# Patient Record
Sex: Male | Born: 1970
Health system: Southern US, Community
[De-identification: ages and names within clinical notes are randomized; demographics above are authoritative.]

## PROBLEM LIST (undated history)

## (undated) DIAGNOSIS — H3530 Unspecified macular degeneration: Secondary | ICD-10-CM

## (undated) DIAGNOSIS — G4730 Sleep apnea, unspecified: Secondary | ICD-10-CM

## (undated) DIAGNOSIS — E785 Hyperlipidemia, unspecified: Secondary | ICD-10-CM

## (undated) DIAGNOSIS — E119 Type 2 diabetes mellitus without complications: Secondary | ICD-10-CM

## (undated) DIAGNOSIS — I10 Essential (primary) hypertension: Secondary | ICD-10-CM

## (undated) HISTORY — DX: Essential (primary) hypertension: I10

## (undated) HISTORY — DX: Sleep apnea, unspecified: G47.30

## (undated) HISTORY — DX: Type 2 diabetes mellitus without complications: E11.9

## (undated) HISTORY — DX: Unspecified macular degeneration: H35.30

## (undated) HISTORY — DX: Hyperlipidemia, unspecified: E78.5

---

## 1998-09-12 ENCOUNTER — Emergency Department (HOSPITAL_COMMUNITY): Admission: EM | Admit: 1998-09-12 | Discharge: 1998-09-12 | Payer: Self-pay

## 2003-06-07 ENCOUNTER — Encounter: Payer: Self-pay | Admitting: Family Medicine

## 2003-06-07 ENCOUNTER — Encounter: Admission: RE | Admit: 2003-06-07 | Discharge: 2003-06-07 | Payer: Self-pay | Admitting: Family Medicine

## 2006-07-30 ENCOUNTER — Ambulatory Visit: Payer: Self-pay | Admitting: Family Medicine

## 2006-09-19 ENCOUNTER — Ambulatory Visit: Payer: Self-pay | Admitting: Family Medicine

## 2006-11-12 ENCOUNTER — Ambulatory Visit: Payer: Self-pay | Admitting: Family Medicine

## 2007-01-16 ENCOUNTER — Ambulatory Visit: Payer: Self-pay | Admitting: Family Medicine

## 2007-01-16 LAB — CONVERTED CEMR LAB
CO2: 29 meq/L (ref 19–32)
Calcium: 9.4 mg/dL (ref 8.4–10.5)
Chloride: 104 meq/L (ref 96–112)
Cholesterol: 195 mg/dL (ref 0–200)
Glucose, Bld: 99 mg/dL (ref 70–99)
HDL: 38.7 mg/dL — ABNORMAL LOW (ref >39.0)
Sodium: 140 meq/L (ref 135–145)
Total CHOL/HDL Ratio: 5

## 2007-02-05 ENCOUNTER — Ambulatory Visit: Payer: Self-pay | Admitting: Family Medicine

## 2007-02-11 DIAGNOSIS — I10 Essential (primary) hypertension: Secondary | ICD-10-CM

## 2007-03-23 ENCOUNTER — Ambulatory Visit: Payer: Self-pay | Admitting: Family Medicine

## 2007-03-23 LAB — CONVERTED CEMR LAB
Chloride: 101 meq/L (ref 96–112)
Creatinine, Ser: 1.1 mg/dL (ref 0.4–1.5)
Glucose, Bld: 92 mg/dL (ref 70–99)
Potassium: 3.6 meq/L (ref 3.5–5.1)
Sodium: 138 meq/L (ref 135–145)

## 2007-06-04 ENCOUNTER — Ambulatory Visit: Payer: Self-pay | Admitting: Family Medicine

## 2007-06-05 ENCOUNTER — Telehealth (INDEPENDENT_AMBULATORY_CARE_PROVIDER_SITE_OTHER): Payer: Self-pay | Admitting: *Deleted

## 2007-06-05 LAB — CONVERTED CEMR LAB
Cholesterol: 220 mg/dL (ref 0–200)
Direct LDL: 158.5 mg/dL
Total CHOL/HDL Ratio: 5
Triglycerides: 70 mg/dL (ref 0–149)
VLDL: 14 mg/dL (ref 0–40)

## 2007-07-29 ENCOUNTER — Telehealth (INDEPENDENT_AMBULATORY_CARE_PROVIDER_SITE_OTHER): Payer: Self-pay | Admitting: *Deleted

## 2007-12-08 ENCOUNTER — Telehealth (INDEPENDENT_AMBULATORY_CARE_PROVIDER_SITE_OTHER): Payer: Self-pay | Admitting: *Deleted

## 2007-12-09 ENCOUNTER — Ambulatory Visit: Payer: Self-pay | Admitting: Family Medicine

## 2007-12-09 LAB — CONVERTED CEMR LAB
BUN: 14 mg/dL (ref 6–23)
Calcium: 9.6 mg/dL (ref 8.4–10.5)
Chloride: 100 meq/L (ref 96–112)
Direct LDL: 143.3 mg/dL
GFR calc Af Amer: 97 mL/min
GFR calc non Af Amer: 80 mL/min
Total CHOL/HDL Ratio: 4.7
Triglycerides: 88 mg/dL (ref 0–149)
VLDL: 18 mg/dL (ref 0–40)

## 2007-12-10 ENCOUNTER — Encounter (INDEPENDENT_AMBULATORY_CARE_PROVIDER_SITE_OTHER): Payer: Self-pay | Admitting: *Deleted

## 2007-12-11 ENCOUNTER — Encounter (INDEPENDENT_AMBULATORY_CARE_PROVIDER_SITE_OTHER): Payer: Self-pay | Admitting: Family Medicine

## 2007-12-30 ENCOUNTER — Encounter (INDEPENDENT_AMBULATORY_CARE_PROVIDER_SITE_OTHER): Payer: Self-pay | Admitting: Family Medicine

## 2008-03-03 ENCOUNTER — Encounter (INDEPENDENT_AMBULATORY_CARE_PROVIDER_SITE_OTHER): Payer: Self-pay | Admitting: Family Medicine

## 2008-05-20 ENCOUNTER — Telehealth (INDEPENDENT_AMBULATORY_CARE_PROVIDER_SITE_OTHER): Payer: Self-pay | Admitting: *Deleted

## 2008-08-29 ENCOUNTER — Telehealth (INDEPENDENT_AMBULATORY_CARE_PROVIDER_SITE_OTHER): Payer: Self-pay | Admitting: *Deleted

## 2008-08-31 ENCOUNTER — Ambulatory Visit: Payer: Self-pay | Admitting: Family Medicine

## 2008-10-31 ENCOUNTER — Telehealth (INDEPENDENT_AMBULATORY_CARE_PROVIDER_SITE_OTHER): Payer: Self-pay | Admitting: *Deleted

## 2008-11-02 ENCOUNTER — Telehealth (INDEPENDENT_AMBULATORY_CARE_PROVIDER_SITE_OTHER): Payer: Self-pay | Admitting: *Deleted

## 2008-12-06 ENCOUNTER — Ambulatory Visit: Payer: Self-pay | Admitting: Family Medicine

## 2008-12-06 DIAGNOSIS — R1084 Generalized abdominal pain: Secondary | ICD-10-CM

## 2008-12-06 LAB — CONVERTED CEMR LAB
Bilirubin Urine: NEGATIVE
Glucose, Urine, Semiquant: NEGATIVE
Ketones, urine, test strip: NEGATIVE
Specific Gravity, Urine: 1.015
Urobilinogen, UA: 0.2
pH: 5

## 2009-04-12 ENCOUNTER — Ambulatory Visit: Payer: Self-pay | Admitting: Family Medicine

## 2009-04-12 DIAGNOSIS — J309 Allergic rhinitis, unspecified: Secondary | ICD-10-CM | POA: Insufficient documentation

## 2009-04-14 ENCOUNTER — Encounter (INDEPENDENT_AMBULATORY_CARE_PROVIDER_SITE_OTHER): Payer: Self-pay | Admitting: *Deleted

## 2009-04-14 LAB — CONVERTED CEMR LAB
Albumin: 4 g/dL (ref 3.5–5.2)
BUN: 9 mg/dL (ref 6–23)
Basophils Absolute: 0 10*3/uL (ref 0.0–0.1)
CO2: 32 meq/L (ref 19–32)
Calcium: 9 mg/dL (ref 8.4–10.5)
Cholesterol: 195 mg/dL (ref 0–200)
Eosinophils Absolute: 0.1 10*3/uL (ref 0.0–0.7)
GFR calc non Af Amer: 106.83 mL/min (ref >60)
Glucose, Bld: 108 mg/dL — ABNORMAL HIGH (ref 70–99)
HCT: 39.3 % (ref 39.0–52.0)
HDL: 41.7 mg/dL (ref >39.00)
Hemoglobin: 13.2 g/dL (ref 13.0–17.0)
Lymphocytes Relative: 36.4 % (ref 12.0–46.0)
Lymphs Abs: 1.3 10*3/uL (ref 0.7–4.0)
MCHC: 33.5 g/dL (ref 30.0–36.0)
Neutro Abs: 1.8 10*3/uL (ref 1.4–7.7)
Platelets: 218 10*3/uL (ref 150.0–400.0)
RDW: 11.5 % (ref 11.5–14.6)
Sodium: 140 meq/L (ref 135–145)
TSH: 1.04 microintl units/mL (ref 0.35–5.50)
Total Bilirubin: 0.8 mg/dL (ref 0.3–1.2)
VLDL: 13.4 mg/dL (ref 0.0–40.0)

## 2009-05-04 ENCOUNTER — Telehealth (INDEPENDENT_AMBULATORY_CARE_PROVIDER_SITE_OTHER): Payer: Self-pay | Admitting: *Deleted

## 2009-06-21 ENCOUNTER — Ambulatory Visit: Payer: Self-pay | Admitting: Family Medicine

## 2009-06-21 DIAGNOSIS — M549 Dorsalgia, unspecified: Secondary | ICD-10-CM | POA: Insufficient documentation

## 2009-07-05 ENCOUNTER — Ambulatory Visit: Payer: Self-pay | Admitting: Family Medicine

## 2009-07-22 ENCOUNTER — Emergency Department (HOSPITAL_COMMUNITY): Admission: EM | Admit: 2009-07-22 | Discharge: 2009-07-22 | Payer: Self-pay | Admitting: Emergency Medicine

## 2009-07-24 ENCOUNTER — Telehealth (INDEPENDENT_AMBULATORY_CARE_PROVIDER_SITE_OTHER): Payer: Self-pay | Admitting: *Deleted

## 2009-08-07 ENCOUNTER — Ambulatory Visit: Payer: Self-pay | Admitting: Family Medicine

## 2009-08-07 DIAGNOSIS — R17 Unspecified jaundice: Secondary | ICD-10-CM | POA: Insufficient documentation

## 2009-08-07 DIAGNOSIS — H5789 Other specified disorders of eye and adnexa: Secondary | ICD-10-CM

## 2009-08-09 ENCOUNTER — Encounter (INDEPENDENT_AMBULATORY_CARE_PROVIDER_SITE_OTHER): Payer: Self-pay | Admitting: *Deleted

## 2009-08-09 LAB — CONVERTED CEMR LAB
AST: 24 units/L (ref 0–37)
Alkaline Phosphatase: 40 units/L (ref 39–117)
Bilirubin, Direct: 0.1 mg/dL (ref 0.0–0.3)

## 2009-08-24 ENCOUNTER — Telehealth (INDEPENDENT_AMBULATORY_CARE_PROVIDER_SITE_OTHER): Payer: Self-pay | Admitting: *Deleted

## 2009-09-07 ENCOUNTER — Encounter: Payer: Self-pay | Admitting: Family Medicine

## 2009-10-10 ENCOUNTER — Ambulatory Visit: Payer: Self-pay | Admitting: Family Medicine

## 2009-12-30 HISTORY — PX: BACK SURGERY: SHX140

## 2010-01-05 ENCOUNTER — Ambulatory Visit (HOSPITAL_COMMUNITY): Admission: RE | Admit: 2010-01-05 | Discharge: 2010-01-06 | Payer: Self-pay | Admitting: Orthopedic Surgery

## 2010-01-16 ENCOUNTER — Telehealth (INDEPENDENT_AMBULATORY_CARE_PROVIDER_SITE_OTHER): Payer: Self-pay | Admitting: *Deleted

## 2010-02-09 ENCOUNTER — Ambulatory Visit: Payer: Self-pay | Admitting: Internal Medicine

## 2010-06-19 ENCOUNTER — Ambulatory Visit: Payer: Self-pay | Admitting: Family Medicine

## 2010-06-19 DIAGNOSIS — R220 Localized swelling, mass and lump, head: Secondary | ICD-10-CM

## 2010-06-19 DIAGNOSIS — R221 Localized swelling, mass and lump, neck: Secondary | ICD-10-CM

## 2010-08-10 ENCOUNTER — Telehealth: Payer: Self-pay | Admitting: Family Medicine

## 2010-08-23 ENCOUNTER — Telehealth (INDEPENDENT_AMBULATORY_CARE_PROVIDER_SITE_OTHER): Payer: Self-pay | Admitting: *Deleted

## 2010-09-07 ENCOUNTER — Ambulatory Visit: Payer: Self-pay | Admitting: Family Medicine

## 2010-09-10 LAB — CONVERTED CEMR LAB
ALT: 32 units/L (ref 0–53)
AST: 23 units/L (ref 0–37)
BUN: 14 mg/dL (ref 6–23)
Basophils Relative: 0.3 % (ref 0.0–3.0)
Eosinophils Relative: 3.1 % (ref 0.0–5.0)
GFR calc non Af Amer: 108.58 mL/min (ref >60)
HCT: 39.1 % (ref 39.0–52.0)
HDL: 37.8 mg/dL — ABNORMAL LOW (ref >39.00)
Hemoglobin: 13.5 g/dL (ref 13.0–17.0)
Lymphs Abs: 1.4 10*3/uL (ref 0.7–4.0)
Monocytes Relative: 10 % (ref 3.0–12.0)
Platelets: 218 10*3/uL (ref 150.0–400.0)
Potassium: 4.1 meq/L (ref 3.5–5.1)
RBC: 4.64 M/uL (ref 4.22–5.81)
Sodium: 141 meq/L (ref 135–145)
TSH: 1.29 microintl units/mL (ref 0.35–5.50)
Total Bilirubin: 0.7 mg/dL (ref 0.3–1.2)
Total CHOL/HDL Ratio: 5
Total Protein: 7 g/dL (ref 6.0–8.3)
VLDL: 11.8 mg/dL (ref 0.0–40.0)
WBC: 3.4 10*3/uL — ABNORMAL LOW (ref 4.5–10.5)

## 2010-09-14 ENCOUNTER — Ambulatory Visit: Payer: Self-pay | Admitting: Family Medicine

## 2010-09-14 DIAGNOSIS — E1169 Type 2 diabetes mellitus with other specified complication: Secondary | ICD-10-CM

## 2010-09-14 DIAGNOSIS — E785 Hyperlipidemia, unspecified: Secondary | ICD-10-CM

## 2011-01-31 NOTE — Assessment & Plan Note (Signed)
Summary: cpx/lab/cbs   Vital Signs:  Patient profile:   40 year old male Height:      72 inches Weight:      251 pounds BMI:     34.16 Pulse rate:   76 / minute BP sitting:   124 / 80  (left arm)  Vitals Entered By: Doristine Devoid CMA (September 07, 2010 10:03 AM) CC: CPX AND LABS    History of Present Illness: 40 yo man here today for CPE.  no concerns today.  Preventive Screening-Counseling & Management  Alcohol-Tobacco     Smoking Status: never  Caffeine-Diet-Exercise     Does Patient Exercise: yes     Type of exercise: running      Sexual History:  currently monogamous.        Drug Use:  never.    Current Medications (verified): 1)  Norvasc 10 Mg Tabs (Amlodipine Besylate) .... Take 1 Tablet By Mouth Once A Day 2)  Coreg 6.25 Mg Tabs (Carvedilol) .... Take One Tablet Two Times A Day 3)  Fluticasone Propionate 50 Mcg/act  Susp (Fluticasone Propionate) .... 2 Sprays Each Nostril Once Daily 4)  Fish Oil 1200 Mg Caps (Omega-3 Fatty Acids) .... Take One Tablet Daily 5)  Potassium Gluconate 550 Mg Tabs (Potassium Gluconate) .... Take One Tablet Daily  Allergies (verified): 1)  ! 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    Physical Exam  General:  alert and well-developed.   Head:  Normocephalic and atraumatic  without obvious abnormalities. No apparent alopecia or balding. Eyes:  No corneal or conjunctival inflammation noted. EOMI. Perrla. Funduscopic exam benign, without hemorrhages, exudates or papilledema. Vision grossly normal. Ears:  External ear exam shows no significant lesions or deformities.  Otoscopic examination reveals clear canals, tympanic membranes are intact bilaterally without bulging, retraction, inflammation or discharge. Hearing is grossly normal bilaterally. Nose:  External nasal examination shows no deformity or inflammation. Nasal mucosa are pink and moist without lesions or exudates. Mouth:  Oral mucosa and oropharynx without lesions or exudates.  Teeth in good repair.  Neck:  No deformities, masses, or tenderness noted. Lungs:  Normal respiratory effort, chest expands symmetrically. Lungs are clear to auscultation, no crackles or wheezes. Heart:  normal rate, regular rhythm, and no murmur.   Abdomen:  soft, NT/ND, +BS Genitalia:  Testes bilaterally descended without nodularity, tenderness or masses. No scrotal masses or lesions. No penis lesions or urethral discharge. Pulses:  +2 carotid, radial, DP Extremities:  No clubbing, cyanosis, edema, or deformity noted with normal full range of motion of all joints.   Neurologic:  No cranial nerve deficits noted. Station and gait are normal. Plantar reflexes are down-going bilaterally. DTRs are symmetrical throughout. Sensory, motor and coordinative functions appear intact. Skin:  Intact without suspicious lesions or rashes Cervical Nodes:  No lymphadenopathy noted Axillary Nodes:  No palpable lymphadenopathy Psych:  Cognition and judgment appear intact. Alert and cooperative with normal attention span and concentration. No apparent delusions, illusions, hallucinations  Impression & Recommendations:  Problem # 1:  PREVENTIVE HEALTH CARE (ICD-V70.0) Assessment Unchanged pt's PE WNL.  check labs.  anticipatory guidance  provided. Orders: Venipuncture (04540) Specimen Handling (98119) TLB-Lipid Panel (80061-LIPID) TLB-BMP (Basic Metabolic Panel-BMET) (80048-METABOL) TLB-CBC Platelet - w/Differential (85025-CBCD) TLB-Hepatic/Liver Function Pnl (80076-HEPATIC) TLB-TSH (Thyroid Stimulating Hormone) (84443-TSH)  Problem # 2:  HYPERTENSION (ICD-401.9) Assessment: Unchanged adequate control today, asymptomatic.  no changes. His updated medication list for this problem includes:    Norvasc 10 Mg Tabs (Amlodipine besylate) .Marland Kitchen... Take 1 tablet by mouth once a day    Coreg 6.25 Mg Tabs (Carvedilol) .Marland Kitchen... Take one tablet two times a day  Complete Medication List: 1)  Norvasc 10 Mg Tabs (Amlodipine besylate) .... Take 1 tablet by mouth once a day 2)  Coreg 6.25 Mg Tabs (Carvedilol) .... Take one tablet two times a day 3)  Fluticasone Propionate 50 Mcg/act Susp (Fluticasone propionate) .... 2 sprays each nostril once daily 4)  Fish Oil 1200 Mg Caps (Omega-3 fatty acids) .... Take one tablet daily 5)  Potassium Gluconate 550 Mg Tabs (Potassium gluconate) .... Take one tablet daily 6)  Lipitor 20 Mg Tabs (Atorvastatin calcium) .... Take 1 tab by mouth daily  Patient Instructions: 1)  Follow up in 6 months to recheck blood pressure 2)  Your exam looks great!  Keep up the good work! 3)  We'll notify you of your lab results 4)  Call with any questions or concerns 5)  Happy Anniversary!

## 2011-01-31 NOTE — Progress Notes (Signed)
Summary: flonase refill   Phone Note Refill Request Call back at Work Phone [phone] Message from:  Patient on August 23, 2010 3:33 PM  Refills Requested: Medication #1:  FLUTICASONE PROPIONATE 50 MCG/ACT  SUSP 2 sprays each nostril once daily   Dosage confirmed as above?Dosage Confirmed   Supply Requested: 1 month [facility] [address]  Next Appointment Scheduled: 08/30/10 Initial call taken by: Lavell Islam,  August 23, 2010 3:33 PM    Prescriptions: FLUTICASONE PROPIONATE 50 MCG/ACT  SUSP (FLUTICASONE PROPIONATE) 2 sprays each nostril once daily  #1 x 3   Entered by:   Doristine Devoid CMA   Authorized by:   Neena Rhymes MD   Signed by:   Doristine Devoid CMA on 08/23/2010   Method used:   Electronically to        [facility] (retail)       [address]       [place], [place]  [address]       Ph: [phone]       Fax: [phone]   RxID:   [id number]

## 2011-01-31 NOTE — Assessment & Plan Note (Signed)
Summary: DISCUSS LIPITOR.////SPH   Vital Signs:  Patient profile:   40 year old male Height:      72 inches Weight:      253 pounds Pulse rate:   68 / minute Resp:     16 per minute BP sitting:   130 / 100  (left arm)  Vitals Entered By: Jeremy Johann CMA (September 14, 2010 11:13 AM) CC: discuss lipitor   History of Present Illness: 40 yo man here today to discuss cholesterol.  wants to know what #s were and what they now are.  wants to know about the medicine and possible side effects.  Current Medications (verified): 1)  Norvasc 10 Mg Tabs (Amlodipine Besylate) .... Take 1 Tablet By Mouth Once A Day 2)  Coreg 6.25 Mg Tabs (Carvedilol) .... Take One Tablet Two Times A Day 3)  Fluticasone Propionate 50 Mcg/act  Susp (Fluticasone Propionate) .... 2 Sprays Each Nostril Once Daily 4)  Fish Oil 1200 Mg Caps (Omega-3 Fatty Acids) .... Take One Tablet Daily 5)  Potassium Gluconate 550 Mg Tabs (Potassium Gluconate) .... Take One Tablet Daily  Allergies (verified): 1)  ! * Lisinopril  Past History:  Past Medical History: Hypertension Hyperlipidemia  Review of Systems      See HPI  Physical Exam  General:  alert and well-developed.   Psych:  Cognition and judgment appear intact. Alert and cooperative with normal attention span and concentration. No apparent delusions, illusions, hallucinations   Impression & Recommendations:  Problem # 1:  HYPERLIPIDEMIA (ICD-272.4) Assessment New pt's cholesterol has increased 25 points since last check.  has + family hx.  pt intends to work on diet and exercise.  discussed need to repeat LFTs and reviewed side effects- abd pain, N/V, myalgias.  pt will start meds- samples and coupon card given. His updated medication list for this problem includes:    Lipitor 20 Mg Tabs (Atorvastatin calcium) .Marland Kitchen... Take 1 tab by mouth daily  Complete Medication List: 1)  Norvasc 10 Mg Tabs (Amlodipine besylate) .... Take 1 tablet by mouth once a  day 2)  Coreg 6.25 Mg Tabs (Carvedilol) .... Take one tablet two times a day 3)  Fluticasone Propionate 50 Mcg/act Susp (Fluticasone propionate) .... 2 sprays each nostril once daily 4)  Fish Oil 1200 Mg Caps (Omega-3 fatty acids) .... Take one tablet daily 5)  Potassium Gluconate 550 Mg Tabs (Potassium gluconate) .... Take one tablet daily 6)  Lipitor 20 Mg Tabs (Atorvastatin calcium) .... Take 1 tab by mouth daily  Patient Instructions: 1)  Schedule a lab visit in 6-8 weeks to recheck liver function 2)  Take Lipitor nightly before bed 3)  Focus on diet and exercise 4)  If you have any problems or concerns- call me! 5)  Don't beat yourself up about this! Prescriptions: LIPITOR 20 MG TABS (ATORVASTATIN CALCIUM) Take 1 tab by mouth daily  #30 x 3   Entered and Authorized by:   Neena Rhymes MD   Signed by:   Neena Rhymes MD on 09/14/2010   Method used:   Electronically to        [facility] [address]* (retail)       [address]       [place], [place]  [address]       Ph: [phone]       Fax: [phone]   RxID:   [id number]

## 2011-01-31 NOTE — Progress Notes (Signed)
Summary: norvasc refill   Phone Note Refill Request Message from:  Fax from Pharmacy [facility] [address] fax [phone]  amlodipine besylate 10 mg  Initial call taken by: Barb Merino,  January 16, 2010 9:30 AM    Prescriptions: NORVASC 10 MG TABS (AMLODIPINE BESYLATE) Take 1 tablet by mouth once a day  #30 Tablet x 1   Entered by:   Doristine Devoid   Authorized by:   Neena Rhymes MD   Signed by:   Doristine Devoid on 01/16/2010   Method used:   [facility]ron[phone][facility] (retail)       [address]       [place], [place]  [address]       Ph: [phone] or [phone]       Fax: [phone]   RxID:   [id number]

## 2011-01-31 NOTE — Letter (Signed)
Summary: Medical Report Form/NCDMV  Medical Report Form/[facility]   Imported By: Lanelle Bal 02/13/2010 12:33:16  _____________________________________________________________________  External Attachment:    Type:   Image     Comment:   External Document

## 2011-01-31 NOTE — Assessment & Plan Note (Signed)
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 Take 1 Tablet By Mouth Once A Day 2)  Coreg 6.25 Mg Tabs (Carvedilol) .... Take One Tablet Two Times A Day 3)  Fluticasone Propionate 50 Mcg/act  Susp (Fluticasone Propionate) .... 2 Sprays Each Nostril Once Daily 4)  Naprosyn 500 Mg Tabs (Naproxen) .Marland Kitchen.. 1 Two Times A Day X10 Days and Then As Needed.  Take W/ Food.  Allergies (verified): 1)  ! * Lisinopril  Past History:  Past Medical History: Reviewed history from 02/11/2007 and no changes required. Hypertension  Past Surgical History: back surgery 12-2009  Social History: Reviewed history from 04/12/2009 and no changes required. 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  Physical Exam  General:  alert and well-developed.   Lungs:  Normal respiratory effort, chest expands symmetrically. Lungs are clear to auscultation, no crackles or wheezes. Heart:  normal rate, regular rhythm, and no murmur.   Extremities:  no pretibial edema bilaterally    Impression & Recommendations:  Problem # 1:  HYPERTENSION (ICD-401.9) at goal  His updated medication list for this problem includes:    Norvasc 10 Mg Tabs (Amlodipine besylate) .Marland Kitchen... Take 1 tablet by mouth once a day    Coreg 6.25 Mg Tabs (Carvedilol) .Marland Kitchen... Take one tablet two times a  day  BP today: 122/80 Prior BP: 130/82 (08/07/2009)  Labs Reviewed: K+: 3.5 (04/12/2009) Creat: : 1.0 (04/12/2009)   Chol: 195 (04/12/2009)   HDL: 41.70 (04/12/2009)   LDL: 140 (04/12/2009)   TG: 67.0 (04/12/2009)  Problem # 2:  papers for the DMV done  F2F 15 min  Complete Medication List: 1)  Norvasc 10 Mg Tabs (Amlodipine besylate) .... Take 1 tablet by mouth once a day 2)  Coreg 6.25 Mg Tabs (Carvedilol) .... Take one tablet two times a day 3)  Fluticasone Propionate 50 Mcg/act Susp (Fluticasone propionate) .... 2 sprays each nostril once daily 4)  Naprosyn 500 Mg Tabs (Naproxen) .Marland Kitchen.. 1 two times a day x10 days and then as needed.  take w/ food.

## 2011-01-31 NOTE — Progress Notes (Signed)
Summary: refill patient is out of med  Phone Note Refill Request Message from:  Fax from Pharmacy on August 10, 2010 3:04 PM  Refills Requested: Medication #1:  NORVASC 10 MG TABS Take 1 tablet by mouth once a day [facility] [address] - fax [phone]  Initial call taken by: Okey Regal [place],  August 10, 2010 3:05 PM    Prescriptions: NORVASC 10 MG TABS (AMLODIPINE BESYLATE) Take 1 tablet by mouth once a day  #30 Tablet x 0   Entered and Authorized by:   Neena Rhymes MD   Signed by:   Neena Rhymes MD on 08/10/2010   Method used:   Electronically to        [id number] (retail)       [address]       [place], [place]  [address]       Ph: [phone]       Fax: [phone]   RxID:   [id number]

## 2011-01-31 NOTE — Assessment & Plan Note (Signed)
Summary: alergic reaction/cbs   Vital Signs:  Patient profile:   40 year old male Weight:      256 pounds Pulse rate:   66 / minute BP sitting:   140 / 80  (left arm)  Vitals Entered By: Doristine Devoid (June 19, 2010 11:20 AM) CC: allergic reaction? feels like uvula is swollen but not sure if it's from meds or if he is developing seafood allergy   History of Present Illness: 40 yo man here today for ? allergic rxn.  pt feels uvula is swollen, sxs started this AM.  denies any new or different meds/foods.  feet were swollen on Saturday and Sunday- now back to normal.  denies increased salt intake, CP, SOB.  was told last year when he had angioedema that uvula was 'abnormally large'.  not using steroid nasal spray, wife complains of snoring.  pt feels that uvula is 'stuck like a piece of hard candy'.  Current Medications (verified): 1)  Norvasc 10 Mg Tabs (Amlodipine Besylate) .... Take 1 Tablet By Mouth Once A Day 2)  Coreg 6.25 Mg Tabs (Carvedilol) .... Take One Tablet Two Times A Day 3)  Fluticasone Propionate 50 Mcg/act  Susp (Fluticasone Propionate) .... 2 Sprays Each Nostril Once Daily 4)  Fish Oil 1200 Mg Caps (Omega-3 Fatty Acids) .... Take One Tablet Daily 5)  Potassium Gluconate 550 Mg Tabs (Potassium Gluconate) .... Take One Tablet Daily 6)  Prednisone 20 Mg Tabs (Prednisone) .... 2 Tabs Daily X5 Days  Allergies (verified): 1)  ! * Lisinopril  Review of Systems      See HPI  Physical Exam  General:  alert and well-developed.   Head:  Normocephalic and atraumatic without obvious abnormalities. No apparent alopecia or balding. Nose:  markedly edematous turbinates Mouth:  Oral mucosa and oropharynx without lesions or exudates.  Teeth in good repair.  + PND, large uvula w/out evidence of edema Lungs:  Normal respiratory effort, chest expands symmetrically. Lungs are clear to auscultation, no crackles or wheezes. Heart:  normal rate, regular rhythm, and no murmur.      Impression & Recommendations:  Problem # 1:  SWELLING MASS OR LUMP IN HEAD AND NECK (ICD-784.2) Assessment New  pt w/ very large uvula.  after reviewing meds pt is not taking anything that would cause a rxn similar to this.  given markedly edematous turbinates and PND suspect that pt's untreated allergies are contributing to uvular swelling.  tx w/ prednisone.  refer to ENT.  reviewed supportive care and red flags that should prompt return.  Pt expresses understanding and is in agreement w/ this plan.  Orders: ENT Referral (ENT)  Complete Medication List: 1)  Norvasc 10 Mg Tabs (Amlodipine besylate) .... Take 1 tablet by mouth once a day 2)  Coreg 6.25 Mg Tabs (Carvedilol) .... Take one tablet two times a day 3)  Fluticasone Propionate 50 Mcg/act Susp (Fluticasone propionate) .... 2 sprays each nostril once daily 4)  Fish Oil 1200 Mg Caps (Omega-3 fatty acids) .... Take one tablet daily 5)  Potassium Gluconate 550 Mg Tabs (Potassium gluconate) .... Take one tablet daily 6)  Prednisone 20 Mg Tabs (Prednisone) .... 2 tabs daily x5 days  Patient Instructions: 1)  Take the prednisone as directed- take w/ food to avoid upset stomach 2)  Someone will call you with the ENT appt 3)  Add back your blood pressure meds 1 at a time- add the 2nd med after 3 days 4)  Use the nasal spray  to decrease congestion and post nasal drip 5)  Hang in there!!! Prescriptions: FLUTICASONE PROPIONATE 50 MCG/ACT  SUSP (FLUTICASONE PROPIONATE) 2 sprays each nostril once daily  #1 x 3   Entered and Authorized by:   Neena Rhymes MD   Signed by:   Neena Rhymes MD on 06/19/2010   Method used:   [facility]ronically to        [facility] # [phone]* (retail)       [address]       [place], [place]  [address]       Ph: [phone]       Fax: [phone]   RxID:   [id number] PREDNISONE 20 MG TABS (PREDNISONE) 2 tabs daily x5 days  #10 x 0   Entered and Authorized by:   Neena Rhymes  MD   Signed by:   Neena Rhymes MD on 06/19/2010   Method used:   [facility]ronically to        [id number]* (retail)       [address]       [place], [place]  [address]       Ph: [phone]       Fax: [phone]   RxID:   [id number]

## 2011-03-17 LAB — TYPE AND SCREEN: Antibody Screen: NEGATIVE

## 2011-03-21 ENCOUNTER — Other Ambulatory Visit: Payer: Self-pay | Admitting: Family Medicine

## 2011-03-21 MED ORDER — ATORVASTATIN CALCIUM 20 MG PO TABS: 20.0000 mg | ORAL_TABLET | Freq: Every day | ORAL | Status: DC

## 2011-03-21 NOTE — Telephone Encounter (Signed)
Per [facility] pt appears to be due for labs.  Copied from [facility]: LDL has increased from 140 --> 164.  this places him at higher risk for heart attack and stroke.  still encourage regular exercise and healthy diet but also needs to start lipitor 20mg  (will be generic later this fall).  recheck LFTs in 6-8 weeks   Signed by Neena Rhymes MD on 09/07/2010 at 9:27 PM

## 2011-03-27 ENCOUNTER — Other Ambulatory Visit: Payer: Self-pay | Admitting: Family Medicine

## 2011-03-27 NOTE — Telephone Encounter (Signed)
Per [facility], pt is due for 6 mo follow up.

## 2011-04-01 LAB — CBC
Hemoglobin: 12.5 g/dL — ABNORMAL LOW (ref 13.0–17.0)
MCHC: 33.9 g/dL (ref 30.0–36.0)
MCV: 82.8 fL (ref 78.0–100.0)
RBC: 4.44 MIL/uL (ref 4.22–5.81)

## 2011-04-01 LAB — URINALYSIS, ROUTINE W REFLEX MICROSCOPIC
Glucose, UA: NEGATIVE mg/dL
Ketones, ur: NEGATIVE mg/dL
Protein, ur: NEGATIVE mg/dL
Urobilinogen, UA: 0.2 mg/dL (ref 0.0–1.0)

## 2011-04-01 LAB — PROTIME-INR
INR: 0.96 (ref 0.00–1.49)
Prothrombin Time: 12.7 seconds (ref 11.6–15.2)

## 2011-04-01 LAB — APTT: aPTT: 28 seconds (ref 24–37)

## 2011-04-01 LAB — DIFFERENTIAL
Eosinophils Absolute: 0.2 10*3/uL (ref 0.0–0.7)
Lymphocytes Relative: 36 % (ref 12–46)
Lymphs Abs: 1.6 10*3/uL (ref 0.7–4.0)
Neutrophils Relative %: 47 % (ref 43–77)

## 2011-04-01 LAB — COMPREHENSIVE METABOLIC PANEL
CO2: 28 mEq/L (ref 19–32)
Calcium: 9 mg/dL (ref 8.4–10.5)
Creatinine, Ser: 1.06 mg/dL (ref 0.4–1.5)
GFR calc non Af Amer: >60 mL/min (ref >60)
Glucose, Bld: 120 mg/dL — ABNORMAL HIGH (ref 70–99)

## 2011-05-08 ENCOUNTER — Other Ambulatory Visit: Payer: Self-pay | Admitting: Family Medicine

## 2011-05-08 NOTE — Telephone Encounter (Signed)
Pt notified that last refill had notation that labs are needed. Pt will come on Tuesday.

## 2011-05-14 ENCOUNTER — Other Ambulatory Visit: Payer: Self-pay

## 2011-05-17 NOTE — Assessment & Plan Note (Signed)
[facility]                        [place] OFFICE NOTE   NAME:Martos, GAELEN BRAGER                     MRN:          [id number]  DATE:03/23/2007                            DOB:          1971-01-03    REASON FOR VISIT:  Mr. Bartunek is a 40 year old male presenting for  blood pressure check. Of note, Mr. Polio called me on February 04, 2007  for sinusitis. At that point, his blood pressure was 112/80. He has  previously seen me on January 16, 2007 where his Norvasc was increased  from 2.5 mg to 5 mg daily. The patient reports that he had 2.5 mg at  home and was taking 2 of those daily for about a week. He went to the  pharmacy and picked up a refill     Leanne Chang, M.D.     [place]/[facility]  DD: 03/23/2007  DT: 03/23/2007  Job #: [id number]

## 2011-05-17 NOTE — Assessment & Plan Note (Signed)
[facility]                        [place] OFFICE NOTE   NAME:Christian Henry, Christian Henry                     MRN:          [id number]  DATE:03/23/2007                            DOB:          20-Jan-1971    REASON FOR VISIT:  Blood pressure check.   Christian Henry is a 40 year old male presenting for his blood pressure  check.  Of note, I did see Christian Henry on January 16, 2007 for a  sinusitis.  At that point, his blood pressure was 112/80.  Previously, I  had seen him on __________   Sanjuana Mae, M.D.     [facility]  DD: 03/23/2007  DT: 03/23/2007  Job #: [id number]

## 2011-05-22 ENCOUNTER — Telehealth: Payer: Self-pay | Admitting: Family Medicine

## 2011-05-22 NOTE — Telephone Encounter (Signed)
Patient has appt for CPX on 09/27/2011 at 10:00am  He wants to know if he has to "do his labs for his liver" before 2011-09-27 in order to get refills or can he continue to get refills and have labs on Sep 27, 2011 with his physical??   If he needs labs before 2023-09-27, what are orders and codes??    thanks

## 2011-05-22 NOTE — Telephone Encounter (Addendum)
See EMR for lab orders and when labs need to be checked. 272.4. Pt wiil need labs to continue refills. Left message to call office.

## 2011-05-23 NOTE — Telephone Encounter (Signed)
Has lab appt for 06/04/2011 at 8am--can only come in on Tuesday and, due to holiday, this was the soonest he could make

## 2011-06-01 ENCOUNTER — Other Ambulatory Visit: Payer: Self-pay | Admitting: Family Medicine

## 2011-06-03 ENCOUNTER — Other Ambulatory Visit: Payer: Self-pay | Admitting: *Deleted

## 2011-06-03 DIAGNOSIS — E785 Hyperlipidemia, unspecified: Secondary | ICD-10-CM

## 2011-06-03 NOTE — Telephone Encounter (Signed)
Pt is overdue for labs (see sept 2011 ov). Sent refill and left message on voicemail to schedule labs. No further refills can be given until labs are performed.

## 2011-06-04 ENCOUNTER — Other Ambulatory Visit: Payer: Self-pay | Admitting: Family Medicine

## 2011-06-04 ENCOUNTER — Other Ambulatory Visit (INDEPENDENT_AMBULATORY_CARE_PROVIDER_SITE_OTHER): Payer: Managed Care, Other (non HMO)

## 2011-06-04 DIAGNOSIS — E782 Mixed hyperlipidemia: Secondary | ICD-10-CM

## 2011-06-04 DIAGNOSIS — E785 Hyperlipidemia, unspecified: Secondary | ICD-10-CM

## 2011-06-04 LAB — HEPATIC FUNCTION PANEL
AST: 25 U/L (ref 0–37)
Albumin: 3.8 g/dL (ref 3.5–5.2)

## 2011-06-04 NOTE — Telephone Encounter (Signed)
Duplicate request

## 2011-06-04 NOTE — Progress Notes (Signed)
Labs only

## 2011-06-07 ENCOUNTER — Encounter: Payer: Self-pay | Admitting: *Deleted

## 2011-06-12 ENCOUNTER — Encounter: Payer: Self-pay | Admitting: Family Medicine

## 2011-06-13 ENCOUNTER — Other Ambulatory Visit: Payer: Self-pay | Admitting: Family Medicine

## 2011-06-13 NOTE — Telephone Encounter (Signed)
Refill sent, pt CPX is currently scheduled for Sept 2012.

## 2011-06-21 ENCOUNTER — Encounter: Payer: Self-pay | Admitting: Family Medicine

## 2011-07-14 ENCOUNTER — Other Ambulatory Visit: Payer: Self-pay | Admitting: Family Medicine

## 2011-07-15 ENCOUNTER — Other Ambulatory Visit: Payer: Self-pay | Admitting: Family Medicine

## 2011-07-15 NOTE — Telephone Encounter (Signed)
This has already been done, lmovm notifying pharmacy.

## 2011-07-15 NOTE — Telephone Encounter (Signed)
Refill sent.

## 2011-09-10 ENCOUNTER — Encounter: Payer: Self-pay | Admitting: Family Medicine

## 2011-09-20 ENCOUNTER — Other Ambulatory Visit: Payer: Self-pay | Admitting: Family Medicine

## 2011-10-11 ENCOUNTER — Other Ambulatory Visit: Payer: Self-pay | Admitting: Family Medicine

## 2011-10-18 ENCOUNTER — Ambulatory Visit (INDEPENDENT_AMBULATORY_CARE_PROVIDER_SITE_OTHER): Payer: BC Managed Care – PPO | Admitting: Family Medicine

## 2011-10-18 ENCOUNTER — Encounter: Payer: Self-pay | Admitting: Family Medicine

## 2011-10-18 DIAGNOSIS — M7750 Other enthesopathy of unspecified foot: Secondary | ICD-10-CM

## 2011-10-18 DIAGNOSIS — I10 Essential (primary) hypertension: Secondary | ICD-10-CM

## 2011-10-18 DIAGNOSIS — E785 Hyperlipidemia, unspecified: Secondary | ICD-10-CM

## 2011-10-18 DIAGNOSIS — M7740 Metatarsalgia, unspecified foot: Secondary | ICD-10-CM

## 2011-10-18 DIAGNOSIS — Z0000 Encounter for general adult medical examination without abnormal findings: Secondary | ICD-10-CM

## 2011-10-18 LAB — HEPATIC FUNCTION PANEL
AST: 23 U/L (ref 0–37)
Albumin: 4 g/dL (ref 3.5–5.2)
Alkaline Phosphatase: 55 U/L (ref 39–117)
Total Protein: 7.2 g/dL (ref 6.0–8.3)

## 2011-10-18 LAB — CBC WITH DIFFERENTIAL/PLATELET
Eosinophils Relative: 5.8 % — ABNORMAL HIGH (ref 0.0–5.0)
HCT: 38.2 % — ABNORMAL LOW (ref 39.0–52.0)
Lymphs Abs: 1.7 10*3/uL (ref 0.7–4.0)
Monocytes Relative: 10.8 % (ref 3.0–12.0)
Neutrophils Relative %: 45.1 % (ref 43.0–77.0)
Platelets: 224 10*3/uL (ref 150.0–400.0)
WBC: 4.5 10*3/uL (ref 4.5–10.5)

## 2011-10-18 LAB — BASIC METABOLIC PANEL
CO2: 26 mEq/L (ref 19–32)
Glucose, Bld: 121 mg/dL — ABNORMAL HIGH (ref 70–99)
Potassium: 4 mEq/L (ref 3.5–5.1)
Sodium: 140 mEq/L (ref 135–145)

## 2011-10-18 LAB — LIPID PANEL: Total CHOL/HDL Ratio: 3

## 2011-10-18 NOTE — Patient Instructions (Signed)
Follow up in 6 months to recheck blood pressure and cholesterol Keep up the good work!  You look great! We'll notify you of your lab results Call with any questions or concerns Happy Holidays!!

## 2011-10-18 NOTE — Progress Notes (Signed)
  Subjective:    Patient ID: Christian Henry, male    DOB: 1971/07/26, 40 y.o.   MRN: [id number]  HPI CPE- no concerns today w/ exception of:  L foot pain- sxs started ~2 yrs ago, intermittent.  Located on ball of foot.  Will have 'tightness' and mild swelling.  Typically occurs when in reclining and feet are dangling.  No numbness.  Will have increased pain/tightness w/ 1st step.  Almost always occurs late in the day.   Review of Systems Patient reports no vision/hearing changes, anorexia, fever ,adenopathy, persistant/recurrent hoarseness, swallowing issues, chest pain, palpitations, edema, persistant/recurrent cough, hemoptysis, dyspnea (rest,exertional, paroxysmal nocturnal), gastrointestinal  bleeding (melena, rectal bleeding), abdominal pain, excessive heart burn, GU symptoms (dysuria, hematuria, voiding/incontinence issues) syncope, focal weakness, memory loss, numbness & tingling, skin/hair/nail changes, depression, anxiety, abnormal bruising/bleeding, musculoskeletal symptoms/signs.     Objective:   Physical Exam BP 125/70  Pulse 71  Temp(Src) 98 F (36.7 C) (Oral)  Ht 6\' 1"  (1.854 m)  Wt 267 lb 6.4 oz (121.292 kg)  BMI 35.28 kg/m2  General Appearance:    Alert, cooperative, no distress, appears stated age  Head:    Normocephalic, without obvious abnormality, atraumatic  Eyes:    PERRL, conjunctiva/corneas clear, EOM's intact, fundi    benign, both eyes       Ears:    Normal TM's and external ear canals, both ears  Nose:   Nares normal, septum midline, mucosa normal, no drainage   or sinus tenderness  Throat:   Lips, mucosa, and tongue normal; teeth and gums normal  Neck:   Supple, symmetrical, trachea midline, no adenopathy;       thyroid:  No enlargement/tenderness/nodules  Back:     Symmetric, no curvature, ROM normal, no CVA tenderness  Lungs:     Clear to auscultation bilaterally, respirations unlabored  Chest wall:    No tenderness or deformity  Heart:    Regular rate  and rhythm, S1 and S2 normal, no murmur, rub   or gallop  Abdomen:     Soft, non-tender, bowel sounds active all four quadrants,    no masses, no organomegaly  Genitalia:    Normal male without lesion, discharge or tenderness  Rectal:    Deferred due to young age  Extremities:   Extremities normal, atraumatic, no cyanosis or edema.  Dropped metatarsal head on L at site of pt's pain and swelling  Pulses:   2+ and symmetric all extremities  Skin:   Skin color, texture, turgor normal, no rashes or lesions  Lymph nodes:   Cervical, supraclavicular, and axillary nodes normal  Neurologic:   CNII-XII intact. Normal strength, sensation and reflexes      throughout          Assessment & Plan:

## 2011-10-20 DIAGNOSIS — M7740 Metatarsalgia, unspecified foot: Secondary | ICD-10-CM | POA: Insufficient documentation

## 2011-10-20 NOTE — Assessment & Plan Note (Signed)
Typically well controlled.  Check labs.  Adjust meds prn.

## 2011-10-20 NOTE — Assessment & Plan Note (Signed)
Well controlled.  Asymptomatic.  No changes. 

## 2011-10-20 NOTE — Assessment & Plan Note (Signed)
Pt's PE WNL.  Check labs.  Anticipatory guidance provided.  

## 2011-10-20 NOTE — Assessment & Plan Note (Signed)
Pt's L foot pain due to dropped metatarsal head.  Stressed importance of cushioned shoes, ice, elevation prn for swelling.  Pt expressed understanding and is in agreement w/ plan.

## 2011-10-21 ENCOUNTER — Other Ambulatory Visit: Payer: Self-pay | Admitting: Family Medicine

## 2011-10-28 ENCOUNTER — Other Ambulatory Visit: Payer: Self-pay | Admitting: Family Medicine

## 2012-01-15 ENCOUNTER — Other Ambulatory Visit: Payer: Self-pay | Admitting: Family Medicine

## 2012-01-15 MED ORDER — AMLODIPINE BESYLATE 10 MG PO TABS: 10.0000 mg | ORAL_TABLET | Freq: Every day | ORAL | Status: DC

## 2012-01-15 MED ORDER — CARVEDILOL 6.25 MG PO TABS: 6.2500 mg | ORAL_TABLET | Freq: Two times a day (BID) | ORAL | Status: DC

## 2012-01-15 NOTE — Telephone Encounter (Signed)
rx sent to pharmacy by e-script  

## 2012-04-09 ENCOUNTER — Other Ambulatory Visit: Payer: Self-pay | Admitting: Family Medicine

## 2012-04-09 MED ORDER — AMLODIPINE BESYLATE 10 MG PO TABS: 10.0000 mg | ORAL_TABLET | Freq: Every day | ORAL | Status: DC

## 2012-04-09 MED ORDER — CARVEDILOL 6.25 MG PO TABS: 6.2500 mg | ORAL_TABLET | Freq: Two times a day (BID) | ORAL | Status: DC

## 2012-04-09 MED ORDER — ATORVASTATIN CALCIUM 20 MG PO TABS: 20.0000 mg | ORAL_TABLET | Freq: Every day | ORAL | Status: DC

## 2012-04-09 NOTE — Telephone Encounter (Signed)
rx sent to pharmacy by e-script Letter has been mailed to pt address noted in the chart to advise they are overdue for cpe/ov/labs and the pt needs to contact office to set up appt   

## 2012-04-09 NOTE — Telephone Encounter (Signed)
Refill Amlodipine 10MG  TAB ASCE Qty 30 Last filled 3.24.13 Take one tablet by mouth one time daily  Last CPE 10.19.12 & Atorvastatin 20MG  TAB GREEN Qty 30 Last filled 4.10.13 Take one tablet by mouth one time daily & Carvedilol 6.25MG  TAB TEVA Qty 60 Last filled 3.24.13 Take 1-tablet by mouth 2 (Two) Times daily with a meal

## 2012-05-28 ENCOUNTER — Ambulatory Visit: Payer: BC Managed Care – PPO | Admitting: Family Medicine

## 2012-06-01 ENCOUNTER — Encounter: Payer: Self-pay | Admitting: Family Medicine

## 2012-06-01 ENCOUNTER — Ambulatory Visit (INDEPENDENT_AMBULATORY_CARE_PROVIDER_SITE_OTHER): Payer: BC Managed Care – PPO | Admitting: Family Medicine

## 2012-06-01 VITALS — BP 137/80 | HR 68 | Temp 97.9°F | Ht 72.5 in | Wt 272.6 lb

## 2012-06-01 DIAGNOSIS — I10 Essential (primary) hypertension: Secondary | ICD-10-CM

## 2012-06-01 DIAGNOSIS — E785 Hyperlipidemia, unspecified: Secondary | ICD-10-CM

## 2012-06-01 LAB — HEPATIC FUNCTION PANEL
ALT: 32 U/L (ref 0–53)
Alkaline Phosphatase: 52 U/L (ref 39–117)
Bilirubin, Direct: 0 mg/dL (ref 0.0–0.3)
Total Protein: 7.1 g/dL (ref 6.0–8.3)

## 2012-06-01 LAB — LIPID PANEL
HDL: 43.9 mg/dL (ref >39.00)
Total CHOL/HDL Ratio: 3

## 2012-06-01 LAB — BASIC METABOLIC PANEL
CO2: 28 mEq/L (ref 19–32)
Chloride: 106 mEq/L (ref 96–112)
Sodium: 140 mEq/L (ref 135–145)

## 2012-06-01 NOTE — Progress Notes (Signed)
  Subjective:    Patient ID: Christian Henry, male    DOB: [birth year], 41 y.o.   MRN: [id number]  HPI HTN- chronic problem, pt having stressful morning.  On Norvasc and Coreg.  Denies CP, SOB, HAs, visual changes, edema.  Hyperlipidemia- chronic problem, on Lipitor.  Denies abd pain, N/V, myalgias.   Review of Systems For ROS see HPI     Objective:   Physical Exam  Vitals reviewed. Constitutional: He is oriented to person, place, and time. He appears well-developed and well-nourished. No distress.  HENT:  Head: Normocephalic and atraumatic.  Eyes: Conjunctivae and EOM are normal. Pupils are equal, round, and reactive to light.  Neck: Normal range of motion. Neck supple. No thyromegaly present.  Cardiovascular: Normal rate, regular rhythm, normal heart sounds and intact distal pulses.   No murmur heard. Pulmonary/Chest: Effort normal and breath sounds normal. No respiratory distress.  Abdominal: Soft. Bowel sounds are normal. He exhibits no distension.  Musculoskeletal: He exhibits no edema.  Lymphadenopathy:    He has no cervical adenopathy.  Neurological: He is alert and oriented to person, place, and time. No cranial nerve deficit.  Skin: Skin is warm and dry.  Psychiatric: He has a normal mood and affect. His behavior is normal.          Assessment & Plan:

## 2012-06-01 NOTE — Assessment & Plan Note (Signed)
Chronic problem.  Not as well controlled today but pt reports stressful AM.  No med changes.  Will continue to follow.  Pt remains asymptomatic.  Reviewed supportive care and red flags that should prompt return.  Pt expressed understanding and is in agreement w/ plan.

## 2012-06-01 NOTE — Assessment & Plan Note (Signed)
Chronic problem, tolerating statin w/out difficulty.  Check labs.  Adjust meds prn  

## 2012-06-01 NOTE — Patient Instructions (Signed)
Schedule your complete physical in 6 months We'll notify you of your lab results and make any changes if needed Keep up the good work!  You look good! Call with any questions or concerns Hang in there!!!

## 2012-06-02 ENCOUNTER — Encounter: Payer: Self-pay | Admitting: *Deleted

## 2012-07-10 ENCOUNTER — Other Ambulatory Visit: Payer: Self-pay | Admitting: Family Medicine

## 2012-07-10 MED ORDER — CARVEDILOL 6.25 MG PO TABS: 6.2500 mg | ORAL_TABLET | Freq: Two times a day (BID) | ORAL | Status: DC

## 2012-07-10 MED ORDER — AMLODIPINE BESYLATE 10 MG PO TABS: 10.0000 mg | ORAL_TABLET | Freq: Every day | ORAL | Status: DC

## 2012-07-10 NOTE — Telephone Encounter (Signed)
Refills x 2 last ov 6.3.13 1-amlodipine 10mg  tab #30, take one tablet by mouth one time daily  Last fill 6.11.13  2-carvedilol 6.25mg  tab #60, take one tablet by mouth two times daily with a meal  Last fill 6.11.13

## 2012-07-24 ENCOUNTER — Encounter: Payer: Self-pay | Admitting: Family Medicine

## 2012-07-24 ENCOUNTER — Other Ambulatory Visit: Payer: Self-pay | Admitting: Family Medicine

## 2012-07-24 ENCOUNTER — Ambulatory Visit (INDEPENDENT_AMBULATORY_CARE_PROVIDER_SITE_OTHER): Payer: BC Managed Care – PPO | Admitting: Family Medicine

## 2012-07-24 VITALS — BP 132/80 | HR 72 | Temp 98.2°F | Ht 72.0 in | Wt 266.4 lb

## 2012-07-24 DIAGNOSIS — Z0110 Encounter for examination of ears and hearing without abnormal findings: Secondary | ICD-10-CM

## 2012-07-24 DIAGNOSIS — Z0100 Encounter for examination of eyes and vision without abnormal findings: Secondary | ICD-10-CM

## 2012-07-24 DIAGNOSIS — K1379 Other lesions of oral mucosa: Secondary | ICD-10-CM

## 2012-07-24 DIAGNOSIS — K1370 Unspecified lesions of oral mucosa: Secondary | ICD-10-CM

## 2012-07-24 DIAGNOSIS — I10 Essential (primary) hypertension: Secondary | ICD-10-CM

## 2012-07-24 NOTE — Progress Notes (Signed)
  Subjective:    Patient ID: Christian Henry, male    DOB: 07-02-1971, 41 y.o.   MRN: [id number]  HPI Enlarged uvula- hx of similar.  Was sleeping the other night and had episode where he could not breathe until he sat up b/c uvula blocked airway.  Can turn head and feel it swinging in throat.  Wants this removed.  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 Constitutional: He appears well-developed and well-nourished. No distress.  HENT:  Head: Normocephalic and atraumatic.  Mouth/Throat: No oropharyngeal exudate.       Elongated, low hanging uvula  Neck: Normal range of motion. Neck supple.          Assessment & Plan:

## 2012-07-24 NOTE — Telephone Encounter (Signed)
Refill Atorvastatin Calcium (Tab) LIPITOR 20 MG Take 1 tablet (20 mg total) by mouth daily Qty 30 last fill 6.24.13 Last ov 7.26.13 V70

## 2012-07-24 NOTE — Patient Instructions (Addendum)
Schedule your complete physical for the fall (After Oct 19) We'll call you with your ENT appt for the [place] Call with any questions or concerns Enjoy the rest of your summer!

## 2012-07-27 ENCOUNTER — Ambulatory Visit: Payer: BC Managed Care – PPO | Admitting: Family Medicine

## 2012-07-27 MED ORDER — ATORVASTATIN CALCIUM 20 MG PO TABS: 20.0000 mg | ORAL_TABLET | Freq: Every day | ORAL | Status: DC

## 2012-07-27 NOTE — Telephone Encounter (Signed)
rx sent to pharmacy by e-script  

## 2012-08-04 NOTE — Assessment & Plan Note (Signed)
Chronic.  Stable.  Forms completed for DOT.

## 2012-08-04 NOTE — Assessment & Plan Note (Signed)
New.  Refer to ENT to discussion reduction/removal.  Pt expressed understanding and is in agreement w/ plan.

## 2012-08-06 ENCOUNTER — Encounter: Payer: Self-pay | Admitting: Family Medicine

## 2012-12-14 ENCOUNTER — Telehealth: Payer: Self-pay | Admitting: Family Medicine

## 2012-12-14 DIAGNOSIS — I10 Essential (primary) hypertension: Secondary | ICD-10-CM

## 2012-12-14 MED ORDER — CARVEDILOL 6.25 MG PO TABS: 6.2500 mg | ORAL_TABLET | Freq: Two times a day (BID) | ORAL | Status: DC

## 2012-12-14 MED ORDER — AMLODIPINE BESYLATE 10 MG PO TABS: 10.0000 mg | ORAL_TABLET | Freq: Every day | ORAL | Status: DC

## 2012-12-14 NOTE — Telephone Encounter (Signed)
Refill: Amlodipine 10 mg tab. Take one tablet by mouth one time daily. Qty 30. Last fill 11-12-12

## 2012-12-14 NOTE — Telephone Encounter (Signed)
Refills for amlodipine and coreg called to pharmacy.

## 2012-12-14 NOTE — Telephone Encounter (Signed)
Refill: Carvedilol 6.25 mg. Take one tablet by mouth twice daily with meals. Qty 60. Last fill 11-12-12

## 2012-12-29 ENCOUNTER — Encounter: Payer: Self-pay | Admitting: Family Medicine

## 2012-12-29 ENCOUNTER — Ambulatory Visit (INDEPENDENT_AMBULATORY_CARE_PROVIDER_SITE_OTHER): Payer: BC Managed Care – PPO | Admitting: Family Medicine

## 2012-12-29 VITALS — BP 140/90 | HR 86 | Temp 98.0°F | Ht 72.0 in | Wt 261.8 lb

## 2012-12-29 DIAGNOSIS — J069 Acute upper respiratory infection, unspecified: Secondary | ICD-10-CM

## 2012-12-29 MED ORDER — GUAIFENESIN-CODEINE 100-10 MG/5ML PO SYRP: 10.0000 mL | ORAL_SOLUTION | Freq: Three times a day (TID) | ORAL | Status: DC | PRN

## 2012-12-29 NOTE — Progress Notes (Signed)
  Subjective:    Patient ID: Christian Henry, male    DOB: 02-22-1971, 41 y.o.   MRN: [id number]  HPI URI- sxs started 12/24, + sick contacts (bronchitis/flu).  Feeling better than last week but still having cough.  Cough is intermittently productive.  Taking Coricidin w/ good results.  No current fever.  Tm 102 last Monday.  Last week had body aches, unable to get out of bed.  Body aches have improved.  No N/V, some diarrhea x2 days.  No current facial pain/pressure.   Review of Systems For ROS see HPI     Objective:   Physical Exam  Vitals reviewed. Constitutional: He appears well-developed and well-nourished. No distress.  HENT:  Head: Normocephalic and atraumatic.       No TTP over sinuses + turbinate edema + PND TMs normal bilaterally  Eyes: Conjunctivae normal and EOM are normal. Pupils are equal, round, and reactive to light.  Neck: Normal range of motion. Neck supple.  Cardiovascular: Normal rate, regular rhythm and normal heart sounds.   Pulmonary/Chest: Effort normal and breath sounds normal. No respiratory distress. He has no wheezes.       + dry cough  Lymphadenopathy:    He has no cervical adenopathy.  Skin: Skin is warm and dry.          Assessment & Plan:

## 2012-12-29 NOTE — Assessment & Plan Note (Signed)
Pt w/ apparent flu last week but this is resolving w/out need for Tamiflu.  Cough med prn.  Reviewed supportive care and red flags that should prompt return.  Pt expressed understanding and is in agreement w/ plan.

## 2012-12-29 NOTE — Patient Instructions (Addendum)
You definitely had the flu last week The good news is, you're on the back end of it Take the cough syrup as needed to get rest Hang in there! Happy New Year! Happy Early [place] Ouch!!!

## 2013-01-31 ENCOUNTER — Other Ambulatory Visit: Payer: Self-pay | Admitting: Family Medicine

## 2013-02-03 ENCOUNTER — Encounter: Payer: BC Managed Care – PPO | Admitting: Family Medicine

## 2013-03-10 ENCOUNTER — Encounter: Payer: Self-pay | Admitting: Family Medicine

## 2013-03-10 ENCOUNTER — Ambulatory Visit (INDEPENDENT_AMBULATORY_CARE_PROVIDER_SITE_OTHER): Payer: BC Managed Care – PPO | Admitting: Family Medicine

## 2013-03-10 VITALS — BP 130/82 | HR 68 | Temp 97.7°F | Ht 72.25 in | Wt 267.6 lb

## 2013-03-10 DIAGNOSIS — E785 Hyperlipidemia, unspecified: Secondary | ICD-10-CM

## 2013-03-10 DIAGNOSIS — Z0000 Encounter for general adult medical examination without abnormal findings: Secondary | ICD-10-CM

## 2013-03-10 DIAGNOSIS — I10 Essential (primary) hypertension: Secondary | ICD-10-CM

## 2013-03-10 LAB — HEPATIC FUNCTION PANEL
ALT: 32 U/L (ref 0–53)
AST: 22 U/L (ref 0–37)
Albumin: 3.8 g/dL (ref 3.5–5.2)
Alkaline Phosphatase: 48 U/L (ref 39–117)
Total Protein: 7.2 g/dL (ref 6.0–8.3)

## 2013-03-10 LAB — CBC WITH DIFFERENTIAL/PLATELET
Eosinophils Relative: 3.3 % (ref 0.0–5.0)
HCT: 36 % — ABNORMAL LOW (ref 39.0–52.0)
Hemoglobin: 12.1 g/dL — ABNORMAL LOW (ref 13.0–17.0)
Lymphocytes Relative: 37.7 % (ref 12.0–46.0)
Lymphs Abs: 1.5 10*3/uL (ref 0.7–4.0)
Monocytes Relative: 11 % (ref 3.0–12.0)
Platelets: 223 10*3/uL (ref 150.0–400.0)
WBC: 4.1 10*3/uL — ABNORMAL LOW (ref 4.5–10.5)

## 2013-03-10 LAB — BASIC METABOLIC PANEL
CO2: 23 mEq/L (ref 19–32)
Calcium: 8.6 mg/dL (ref 8.4–10.5)
Chloride: 106 mEq/L (ref 96–112)
Glucose, Bld: 108 mg/dL — ABNORMAL HIGH (ref 70–99)
Potassium: 3.7 mEq/L (ref 3.5–5.1)
Sodium: 139 mEq/L (ref 135–145)

## 2013-03-10 LAB — LIPID PANEL
LDL Cholesterol: 66 mg/dL (ref 0–99)
Total CHOL/HDL Ratio: 3

## 2013-03-10 NOTE — Patient Instructions (Addendum)
Follow up in 6 months to recheck BP/cholesterol We'll notify you of your lab results and make any changes if needed Keep up the good work!  You look great! Call with any questions or concerns Happy Spring!  (Hopefully!)

## 2013-03-10 NOTE — Progress Notes (Signed)
  Subjective:    Patient ID: Christian Henry, male    DOB: 22-Aug-1971, 42 y.o.   MRN: [id number]  HPI CPE- no concerns   Review of Systems Patient reports no vision/hearing changes, anorexia, fever ,adenopathy, persistant/recurrent hoarseness, swallowing issues, chest pain, palpitations, edema, persistant/recurrent cough, hemoptysis, dyspnea (rest,exertional, paroxysmal nocturnal), gastrointestinal  bleeding (melena, rectal bleeding), abdominal pain, excessive heart burn, GU symptoms (dysuria, hematuria, voiding/incontinence issues) syncope, focal weakness, memory loss, numbness & tingling, skin/hair/nail changes, depression, anxiety, abnormal bruising/bleeding, musculoskeletal symptoms/signs.     Objective:   Physical Exam BP 130/82  Pulse 68  Temp(Src) 97.7 F (36.5 C) (Oral)  Ht 6' 0.25" (1.835 m)  Wt 267 lb 9.6 oz (121.383 kg)  BMI 36.05 kg/m2  SpO2 97%  General Appearance:    Alert, cooperative, no distress, appears stated age  Head:    Normocephalic, without obvious abnormality, atraumatic  Eyes:    PERRL, conjunctiva/corneas clear, EOM's intact, fundi    benign, both eyes       Ears:    Normal TM's and external ear canals, both ears  Nose:   Nares normal, septum midline, mucosa normal, no drainage   or sinus tenderness  Throat:   Lips, mucosa, and tongue normal; teeth and gums normal  Neck:   Supple, symmetrical, trachea midline, no adenopathy;       thyroid:  No enlargement/tenderness/nodules; no carotid   bruit or JVD  Back:     Symmetric, no curvature, ROM normal, no CVA tenderness  Lungs:     Clear to auscultation bilaterally, respirations unlabored  Chest wall:    No tenderness or deformity  Heart:    Regular rate and rhythm, S1 and S2 normal, no murmur, rub   or gallop  Abdomen:     Soft, non-tender, bowel sounds active all four quadrants,    no masses, no organomegaly  Genitalia:    Normal male without lesion, discharge or tenderness  Rectal:    Deferred due to young  age  Extremities:   Extremities normal, atraumatic, no cyanosis or edema  Pulses:   2+ and symmetric all extremities  Skin:   Skin color, texture, turgor normal, no rashes or lesions  Lymph nodes:   Cervical, supraclavicular, and axillary nodes normal  Neurologic:   CNII-XII intact. Normal strength, sensation and reflexes      throughout          Assessment & Plan:

## 2013-03-14 NOTE — Assessment & Plan Note (Signed)
Pt's PE WNL.  Check labs.  Anticipatory guidance provided.  

## 2013-03-14 NOTE — Assessment & Plan Note (Signed)
Chronic problem.  Adequate control.  Asymptomatic.  No changes. 

## 2013-03-14 NOTE — Assessment & Plan Note (Signed)
Chronic problem.  Tolerating statin w/out difficulty.  Check labs.  Adjust meds prn  

## 2013-03-15 ENCOUNTER — Encounter: Payer: Self-pay | Admitting: *Deleted

## 2013-05-17 ENCOUNTER — Other Ambulatory Visit: Payer: Self-pay | Admitting: Family Medicine

## 2013-05-17 NOTE — Telephone Encounter (Signed)
Rx sent to the pharmacy by e-script.//AB/CMA 

## 2013-05-28 ENCOUNTER — Other Ambulatory Visit: Payer: Self-pay | Admitting: Family Medicine

## 2013-05-28 NOTE — Telephone Encounter (Signed)
Rx sent to the pharmacy by e-script.//AB/CMA 

## 2013-07-01 ENCOUNTER — Telehealth: Payer: Self-pay | Admitting: Family Medicine

## 2013-07-01 MED ORDER — FLUTICASONE PROPIONATE 50 MCG/ACT NA SUSP: 2.0000 | Freq: Every day | NASAL | Status: DC

## 2013-07-01 NOTE — Telephone Encounter (Signed)
Rx sent to the pharmacy by e-script.//AB/CMA 

## 2013-07-01 NOTE — Telephone Encounter (Signed)
Patient is requesting a refill on his prescription: fluticasone (FLONASE) 50 MCG/ACT nasal spray.

## 2013-08-25 ENCOUNTER — Other Ambulatory Visit: Payer: Self-pay | Admitting: Family Medicine

## 2013-11-06 ENCOUNTER — Other Ambulatory Visit: Payer: Self-pay | Admitting: Family Medicine

## 2013-11-08 NOTE — Telephone Encounter (Signed)
Med filled but pt needs appt for additional refills.

## 2013-12-12 ENCOUNTER — Other Ambulatory Visit: Payer: Self-pay | Admitting: Family Medicine

## 2013-12-14 NOTE — Telephone Encounter (Signed)
Med filled.  

## 2014-02-22 ENCOUNTER — Other Ambulatory Visit: Payer: Self-pay | Admitting: Family Medicine

## 2014-02-22 NOTE — Telephone Encounter (Signed)
Med filled.  

## 2014-03-04 ENCOUNTER — Ambulatory Visit (INDEPENDENT_AMBULATORY_CARE_PROVIDER_SITE_OTHER): Payer: PRIVATE HEALTH INSURANCE | Admitting: Family Medicine

## 2014-03-04 ENCOUNTER — Ambulatory Visit: Payer: BC Managed Care – PPO | Admitting: Family Medicine

## 2014-03-04 ENCOUNTER — Encounter: Payer: Self-pay | Admitting: Family Medicine

## 2014-03-04 VITALS — BP 122/86 | HR 68 | Temp 98.2°F | Resp 16 | Wt 274.2 lb

## 2014-03-04 DIAGNOSIS — M5432 Sciatica, left side: Secondary | ICD-10-CM

## 2014-03-04 DIAGNOSIS — M5430 Sciatica, unspecified side: Secondary | ICD-10-CM

## 2014-03-04 MED ORDER — CYCLOBENZAPRINE HCL 10 MG PO TABS: 10.0000 mg | ORAL_TABLET | Freq: Three times a day (TID) | ORAL | Status: DC | PRN

## 2014-03-04 MED ORDER — PREDNISONE 10 MG PO TABS: ORAL_TABLET | ORAL | Status: DC

## 2014-03-04 NOTE — Progress Notes (Signed)
   Subjective:    Patient ID: Christian Henry, male    DOB: 1971/10/21, 43 y.o.   MRN: [id number]  HPI Back pain- L sided, sxs started ~1 week ago.  Not improving but not worsening.  Pain is radiating into butt/leg- yesterday pain radiated all the way to ankle.  No bowel or bladder incontinence.  No known injury.  No fever.  No relief w/ tylenol.   Review of Systems For ROS see HPI     Objective:   Physical Exam  Vitals reviewed. Constitutional: He is oriented to person, place, and time. He appears well-developed and well-nourished.  Obviously uncomfortable  Cardiovascular: Intact distal pulses.   Musculoskeletal:  Pain w/ back extension>flexion + TTP over L paraspinal and glutes  Neurological: He is alert and oriented to person, place, and time. He has normal reflexes.  + SLR bilaterally          Assessment & Plan:

## 2014-03-04 NOTE — Patient Instructions (Signed)
Reschedule your physical at your convenience Start the Prednisone as directed Flexeril as needed for muscle spasm- will cause drowsiness HEAT! Gentle stretching Call if no improvement in the next 7 days Hang in there!!!

## 2014-03-04 NOTE — Progress Notes (Signed)
Pre visit review using our clinic review tool, if applicable. No additional management support is needed unless otherwise documented below in the visit note. 

## 2014-03-04 NOTE — Assessment & Plan Note (Signed)
New.  Start pred taper.  Muscle relaxer prn.  Reviewed supportive care and red flags that should prompt return.  If no improvement, will refer to ortho.  Pt expressed understanding and is in agreement w/ plan.

## 2014-03-14 ENCOUNTER — Encounter: Payer: BC Managed Care – PPO | Admitting: Family Medicine

## 2014-03-18 ENCOUNTER — Telehealth: Payer: Self-pay

## 2014-03-18 ENCOUNTER — Ambulatory Visit (INDEPENDENT_AMBULATORY_CARE_PROVIDER_SITE_OTHER): Payer: PRIVATE HEALTH INSURANCE | Admitting: Family Medicine

## 2014-03-18 ENCOUNTER — Encounter: Payer: Self-pay | Admitting: Family Medicine

## 2014-03-18 VITALS — BP 124/78 | HR 97 | Temp 98.4°F | Resp 16 | Ht 72.75 in | Wt 274.1 lb

## 2014-03-18 DIAGNOSIS — Z0000 Encounter for general adult medical examination without abnormal findings: Secondary | ICD-10-CM

## 2014-03-18 DIAGNOSIS — M5432 Sciatica, left side: Secondary | ICD-10-CM

## 2014-03-18 DIAGNOSIS — M5430 Sciatica, unspecified side: Secondary | ICD-10-CM

## 2014-03-18 LAB — LIPID PANEL
CHOLESTEROL: 150 mg/dL (ref 0–200)
HDL: 45 mg/dL (ref >39)
LDL Cholesterol: 81 mg/dL (ref 0–99)
TRIGLYCERIDES: 121 mg/dL (ref <150)
Total CHOL/HDL Ratio: 3.3 Ratio
VLDL: 24 mg/dL (ref 0–40)

## 2014-03-18 LAB — CBC WITH DIFFERENTIAL/PLATELET
BASOS PCT: 0 % (ref 0–1)
Basophils Absolute: 0 10*3/uL (ref 0.0–0.1)
EOS ABS: 0.1 10*3/uL (ref 0.0–0.7)
Eosinophils Relative: 2 % (ref 0–5)
HCT: 39.4 % (ref 39.0–52.0)
HEMOGLOBIN: 13.4 g/dL (ref 13.0–17.0)
Lymphocytes Relative: 36 % (ref 12–46)
Lymphs Abs: 2.6 10*3/uL (ref 0.7–4.0)
MCH: 27.4 pg (ref 26.0–34.0)
MCHC: 34 g/dL (ref 30.0–36.0)
MCV: 80.6 fL (ref 78.0–100.0)
MONOS PCT: 10 % (ref 3–12)
Monocytes Absolute: 0.7 10*3/uL (ref 0.1–1.0)
Neutro Abs: 3.7 10*3/uL (ref 1.7–7.7)
Neutrophils Relative %: 52 % (ref 43–77)
PLATELETS: 208 10*3/uL (ref 150–400)
RBC: 4.89 MIL/uL (ref 4.22–5.81)
RDW: 13.9 % (ref 11.5–15.5)
WBC: 7.1 10*3/uL (ref 4.0–10.5)

## 2014-03-18 LAB — HEPATIC FUNCTION PANEL
ALT: 28 U/L (ref 0–53)
AST: 16 U/L (ref 0–37)
Albumin: 4.3 g/dL (ref 3.5–5.2)
Alkaline Phosphatase: 61 U/L (ref 39–117)
BILIRUBIN INDIRECT: 0.4 mg/dL (ref 0.2–1.2)
Bilirubin, Direct: 0.1 mg/dL (ref 0.0–0.3)
TOTAL PROTEIN: 7.4 g/dL (ref 6.0–8.3)
Total Bilirubin: 0.5 mg/dL (ref 0.2–1.2)

## 2014-03-18 LAB — BASIC METABOLIC PANEL
BUN: 20 mg/dL (ref 6–23)
CALCIUM: 9.2 mg/dL (ref 8.4–10.5)
CO2: 26 mEq/L (ref 19–32)
Chloride: 101 mEq/L (ref 96–112)
Creat: 1.04 mg/dL (ref 0.50–1.35)
GLUCOSE: 93 mg/dL (ref 70–99)
Potassium: 4 mEq/L (ref 3.5–5.3)
Sodium: 140 mEq/L (ref 135–145)

## 2014-03-18 MED ORDER — CYCLOBENZAPRINE HCL 10 MG PO TABS: 10.0000 mg | ORAL_TABLET | Freq: Three times a day (TID) | ORAL | Status: DC | PRN

## 2014-03-18 MED ORDER — MELOXICAM 15 MG PO TABS: 15.0000 mg | ORAL_TABLET | Freq: Every day | ORAL | Status: DC

## 2014-03-18 NOTE — Progress Notes (Signed)
   Subjective:    Patient ID: Christian Henry, male    DOB: May 08, 1971, 43 y.o.   MRN: [id number]  HPI CPE- only concern is ongoing sciatica.  Pt has completed prednisone and has a few of the muscle relaxers remaining.   Review of Systems Patient reports no vision/hearing changes, anorexia, fever ,adenopathy, persistant/recurrent hoarseness, swallowing issues, chest pain, palpitations, edema, persistant/recurrent cough, hemoptysis, dyspnea (rest,exertional, paroxysmal nocturnal), gastrointestinal  bleeding (melena, rectal bleeding), abdominal pain, excessive heart burn, GU symptoms (dysuria, hematuria, voiding/incontinence issues) syncope, focal weakness, memory loss, numbness & tingling, skin/hair/nail changes, depression, anxiety, abnormal bruising/bleeding.     Objective:   Physical Exam General Appearance:    Alert, cooperative, no distress, appears stated age  Head:    Normocephalic, without obvious abnormality, atraumatic  Eyes:    PERRL, conjunctiva/corneas clear, EOM's intact, fundi    benign, both eyes       Ears:    Normal TM's and external ear canals, both ears  Nose:   Nares normal, septum midline, mucosa normal, no drainage   or sinus tenderness  Throat:   Lips, mucosa, and tongue normal; teeth and gums normal  Neck:   Supple, symmetrical, trachea midline, no adenopathy;       thyroid:  No enlargement/tenderness/nodules  Back:     Symmetric, no curvature, ROM normal, no CVA tenderness  Lungs:     Clear to auscultation bilaterally, respirations unlabored  Chest wall:    No tenderness or deformity  Heart:    Regular rate and rhythm, S1 and S2 normal, no murmur, rub   or gallop  Abdomen:     Soft, non-tender, bowel sounds active all four quadrants,    no masses, no organomegaly  Genitalia:    Normal male without lesion, masses,discharge or tenderness  Rectal:    Deferred due to young age  Extremities:   Extremities normal, atraumatic, no cyanosis or edema  Pulses:   2+ and  symmetric all extremities  Skin:   Skin color, texture, turgor normal, no rashes or lesions  Lymph nodes:   Cervical, supraclavicular, and axillary nodes normal  Neurologic:   CNII-XII intact. Normal strength, sensation and reflexes      throughout          Assessment & Plan:

## 2014-03-18 NOTE — Patient Instructions (Signed)
Follow up in 6 months to recheck BP and cholesterol We'll notify you of your lab results and make any changes if needed Keep up the good work!  You look great! Start the Mobic daily- take w/ food Continue the flexeril as needed Call if and when you decide you want ortho Happy Spring!!

## 2014-03-18 NOTE — Telephone Encounter (Addendum)
Left message for call back Identifiable  Flu-declined Td- 03/04/06

## 2014-03-18 NOTE — Telephone Encounter (Signed)
Medication List and allergies:  Updated and Reviewed  90 day supply/mail order: n/a Local prescriptions:  [id number] - [place], [place] - [address]  Immunization due:  Influenza-declined  A/P: No changes to personal, family or PSH Flu- declined Tdap- 03/04/06   To discuss with provider: Continues to have some back pain.  Rated 3/10.  Patient was seen on 03/04/14 for Left Sided Sciatica.

## 2014-03-18 NOTE — Progress Notes (Signed)
Pre visit review using our clinic review tool, if applicable. No additional management support is needed unless otherwise documented below in the visit note. 

## 2014-03-19 LAB — TSH: TSH: 1.416 u[IU]/mL (ref 0.350–4.500)

## 2014-03-20 NOTE — Assessment & Plan Note (Signed)
Pt's PE WNL.  Check labs.  Anticipatory guidance provided.  

## 2014-03-20 NOTE — Assessment & Plan Note (Signed)
Improved but still present.  Refill on flexeril.  Daily NSAID.  If no improvement, will need ortho referral.  Pt expressed understanding and is in agreement w/ plan.

## 2014-03-21 ENCOUNTER — Encounter: Payer: Self-pay | Admitting: General Practice

## 2014-03-21 ENCOUNTER — Telehealth: Payer: Self-pay

## 2014-03-21 ENCOUNTER — Other Ambulatory Visit: Payer: Self-pay | Admitting: Family Medicine

## 2014-03-21 NOTE — Telephone Encounter (Signed)
Please tell pt that we use [facility] regularly and that is who I would recommend.  Pt can call and schedule an appt (please give him #)

## 2014-03-21 NOTE — Telephone Encounter (Signed)
Med filled.  

## 2014-03-21 NOTE — Telephone Encounter (Signed)
Patient called and request a referral for his back. States that he is still having pain. Would like a referral to chiropractor before ortho if possible.  Call back [phone]

## 2014-03-21 NOTE — Telephone Encounter (Signed)
Pt advised and given the number for the [address].

## 2014-03-23 ENCOUNTER — Other Ambulatory Visit: Payer: Self-pay | Admitting: Family Medicine

## 2014-03-23 NOTE — Telephone Encounter (Signed)
Med flled.

## 2014-04-13 ENCOUNTER — Telehealth: Payer: Self-pay | Admitting: Family Medicine

## 2014-04-13 DIAGNOSIS — M5432 Sciatica, left side: Secondary | ICD-10-CM

## 2014-04-13 DIAGNOSIS — M549 Dorsalgia, unspecified: Secondary | ICD-10-CM

## 2014-04-13 NOTE — Telephone Encounter (Signed)
Referral placed per last OV note  

## 2014-04-13 NOTE — Telephone Encounter (Signed)
Patient called and stated that he is still having back pain. He would like to be referred to Ortho. Please advise.

## 2014-06-08 ENCOUNTER — Ambulatory Visit (INDEPENDENT_AMBULATORY_CARE_PROVIDER_SITE_OTHER): Payer: PRIVATE HEALTH INSURANCE | Admitting: Internal Medicine

## 2014-06-08 ENCOUNTER — Encounter: Payer: Self-pay | Admitting: Internal Medicine

## 2014-06-08 VITALS — BP 135/83 | HR 81 | Temp 98.3°F | Wt 271.0 lb

## 2014-06-08 DIAGNOSIS — J069 Acute upper respiratory infection, unspecified: Secondary | ICD-10-CM

## 2014-06-08 MED ORDER — FLUTICASONE PROPIONATE 50 MCG/ACT NA SUSP: 2.0000 | Freq: Every day | NASAL | Status: DC

## 2014-06-08 MED ORDER — AMOXICILLIN 500 MG PO CAPS: 1000.0000 mg | ORAL_CAPSULE | Freq: Two times a day (BID) | ORAL | Status: DC

## 2014-06-08 NOTE — Patient Instructions (Signed)
Rest, fluids , tylenol For cough, take Mucinex DM twice a day as needed  For congestion use OTC flonase Take the antibiotic as prescribed  (Amoxicillin) if no better in few days  Call if no better by next week  Call anytime if the symptoms are severe

## 2014-06-08 NOTE — Progress Notes (Signed)
Pre-visit discussion using our clinic review tool. No additional management support is needed unless otherwise documented below in the visit note.  

## 2014-06-08 NOTE — Progress Notes (Signed)
   Subjective:    Patient ID: Lou Miner, male    DOB: [birth year], 43 y.o.   MRN: [id number]  DOS:  06/08/2014 Type of  Visit: acute History: Symptoms started 5 days ago with pressure behind the eyes, sneezing, cough.  2 days ago he also developed some chest congestion. His taking a OTC [facility] cold medication. Name? Request a refill on Flonase which he has taken before.  ROS Denies fever or chills + Right nasal discharge. No chest pain or difficulty breathing Mild production of white sputum.   Past Medical History  Diagnosis Date  . Hyperlipidemia   . Hypertension     Past Surgical History  Procedure Laterality Date  . Back surgery  12/2009    History   Social History  . Marital Status: Married    Spouse Name: N/A    Number of Children: N/A  . Years of Education: N/A   Occupational History  . Not on file.   Social History Main Topics  . Smoking status: Never Smoker   . Smokeless tobacco: Not on file  . Alcohol Use: Yes     Comment: on occasion  . Drug Use: No  . Sexual Activity: Not on file   Other Topics Concern  . Not on file   Social History Narrative   Married, 2 kids.        Medication List       This list is accurate as of: 06/08/14 11:59 PM.  Always use your most recent med list.               amLODipine 10 MG tablet  Commonly known as:  NORVASC  TAKE ONE TABLET BY MOUTH ONE TIME DAILY     amoxicillin 500 MG capsule  Commonly known as:  AMOXIL  Take 2 capsules (1,000 mg total) by mouth 2 (two) times daily.     atorvastatin 20 MG tablet  Commonly known as:  LIPITOR  TAKE ONE TABLET BY MOUTH ONE TIME DAILY     carvedilol 6.25 MG tablet  Commonly known as:  COREG  TAKE ONE TABLET BY MOUTH TWICE DAILY WITH MEALS     Fish Oil 1200 MG Caps  Take by mouth daily.     fluticasone 50 MCG/ACT nasal spray  Commonly known as:  FLONASE  Place 2 sprays into both nostrils daily.     Potassium Gluconate 550 MG Tabs  Take by mouth daily.             Objective:   Physical Exam BP 135/83  Pulse 81  Temp(Src) 98.3 F (36.8 C) (Oral)  Wt 271 lb (122.925 kg)  SpO2 95% General -- alert, well-developed, NAD.   HEENT-- Not pale. TMs : L normal, R obscured by wax, no d/c  Throat symmetric, no redness or discharge.  Face symmetric, sinuses not tender to palpation. Nose  congested. Lungs -- normal respiratory effort, no intercostal retractions, no accessory muscle use, and normal breath sounds.  Heart-- normal rate, regular rhythm, no murmur.  Extremities-- no pretibial edema bilaterally  Neurologic--  alert & oriented X3. Speech normal, gait appropriate for age, strength symmetric and appropriate for age.   Psych-- Cognition and judgment appear intact. Cooperative with normal attention span and concentration. No anxious or depressed appearing.     Assessment & Plan:    URI, see instructions

## 2014-10-31 ENCOUNTER — Other Ambulatory Visit: Payer: Self-pay | Admitting: General Practice

## 2014-10-31 MED ORDER — AMLODIPINE BESYLATE 10 MG PO TABS: ORAL_TABLET | ORAL | Status: DC

## 2014-10-31 MED ORDER — CARVEDILOL 6.25 MG PO TABS: ORAL_TABLET | ORAL | Status: DC

## 2014-10-31 MED ORDER — ATORVASTATIN CALCIUM 20 MG PO TABS: ORAL_TABLET | ORAL | Status: DC

## 2014-12-07 ENCOUNTER — Telehealth: Payer: Self-pay | Admitting: Family Medicine

## 2014-12-07 NOTE — Telephone Encounter (Signed)
Caller name:Sabree, Hulan Relation to IZ:XYOF Call back [place]:  Reason for call: pt states he has a DOT form that needs to be filled out by Dr. 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 Pt states dr. Birdie Riddle has filled them out previously for him and he would like to know if he can drop it off or if he as to be seen .

## 2014-12-07 NOTE — Telephone Encounter (Signed)
Pt notified to drop off forms and we will see if we can complete them.

## 2014-12-07 NOTE — Telephone Encounter (Signed)
As long as it's not a DOT physical we can probably complete them.  He can drop them off for review and we can see what we can do

## 2014-12-07 NOTE — Telephone Encounter (Signed)
Please advise, i do not see any previous forms scanned and i did not think we did DOT forms?

## 2015-01-06 ENCOUNTER — Telehealth: Payer: Self-pay | Admitting: Family Medicine

## 2015-01-06 DIAGNOSIS — Z7689 Persons encountering health services in other specified circumstances: Secondary | ICD-10-CM

## 2015-01-06 NOTE — Telephone Encounter (Signed)
Pt is checking the status of paperwork

## 2015-01-09 NOTE — Telephone Encounter (Signed)
Forms faxed to [facility] per patient request. Copy sent for scanning and originals placed up front for patient to pick up. Patient aware. JG//CMA

## 2015-02-03 ENCOUNTER — Other Ambulatory Visit: Payer: Self-pay | Admitting: General Practice

## 2015-02-03 MED ORDER — ATORVASTATIN CALCIUM 20 MG PO TABS: ORAL_TABLET | ORAL | Status: DC

## 2015-02-03 MED ORDER — CARVEDILOL 6.25 MG PO TABS: ORAL_TABLET | ORAL | Status: DC

## 2015-02-03 MED ORDER — AMLODIPINE BESYLATE 10 MG PO TABS: ORAL_TABLET | ORAL | Status: DC

## 2015-03-09 ENCOUNTER — Telehealth: Payer: Self-pay | Admitting: Family Medicine

## 2015-03-09 NOTE — Telephone Encounter (Signed)
I saw that one too but he cannot come in until after 03/19/15 due to insurance

## 2015-03-09 NOTE — Telephone Encounter (Signed)
I have a CPE opening Monday at 3pm

## 2015-03-09 NOTE — Telephone Encounter (Signed)
Caller name: Jhostin Relation to pt: self Call back number: [phone] Pharmacy:  Reason for call:   We have had to reschedule his appointment twice now. He does not want to wait another couple of months for his cpe. Can we get patient in any sooner? Appt is scheduled for 04/20/15

## 2015-03-09 NOTE — Telephone Encounter (Signed)
You could use the [place] F/u on 4/1 I guess. We are limited because she is out for the [place]IE in 2 weeks.

## 2015-03-09 NOTE — Telephone Encounter (Signed)
Informed patient of this and he does want this appointment.

## 2015-03-21 ENCOUNTER — Encounter: Payer: PRIVATE HEALTH INSURANCE | Admitting: Family Medicine

## 2015-03-31 ENCOUNTER — Ambulatory Visit (INDEPENDENT_AMBULATORY_CARE_PROVIDER_SITE_OTHER): Payer: PRIVATE HEALTH INSURANCE | Admitting: Family Medicine

## 2015-03-31 ENCOUNTER — Encounter: Payer: Self-pay | Admitting: Family Medicine

## 2015-03-31 ENCOUNTER — Other Ambulatory Visit (INDEPENDENT_AMBULATORY_CARE_PROVIDER_SITE_OTHER): Payer: PRIVATE HEALTH INSURANCE

## 2015-03-31 VITALS — BP 130/80 | HR 79 | Temp 98.2°F | Resp 16 | Ht 73.25 in | Wt 282.0 lb

## 2015-03-31 DIAGNOSIS — E049 Nontoxic goiter, unspecified: Secondary | ICD-10-CM | POA: Diagnosis not present

## 2015-03-31 DIAGNOSIS — R7309 Other abnormal glucose: Secondary | ICD-10-CM | POA: Diagnosis not present

## 2015-03-31 DIAGNOSIS — E010 Iodine-deficiency related diffuse (endemic) goiter: Secondary | ICD-10-CM

## 2015-03-31 DIAGNOSIS — R7303 Prediabetes: Secondary | ICD-10-CM

## 2015-03-31 DIAGNOSIS — Z0000 Encounter for general adult medical examination without abnormal findings: Secondary | ICD-10-CM

## 2015-03-31 LAB — CBC WITH DIFFERENTIAL/PLATELET
Basophils Absolute: 0 10*3/uL (ref 0.0–0.1)
Basophils Relative: 0.5 % (ref 0.0–3.0)
Eosinophils Absolute: 0.2 10*3/uL (ref 0.0–0.7)
Eosinophils Relative: 4.3 % (ref 0.0–5.0)
HCT: 40.8 % (ref 39.0–52.0)
Hemoglobin: 13.7 g/dL (ref 13.0–17.0)
Lymphocytes Relative: 39 % (ref 12.0–46.0)
Lymphs Abs: 1.6 10*3/uL (ref 0.7–4.0)
MCHC: 33.5 g/dL (ref 30.0–36.0)
MCV: 81.7 fl (ref 78.0–100.0)
MONO ABS: 0.4 10*3/uL (ref 0.1–1.0)
MONOS PCT: 9.3 % (ref 3.0–12.0)
Neutro Abs: 1.9 10*3/uL (ref 1.4–7.7)
Neutrophils Relative %: 46.9 % (ref 43.0–77.0)
Platelets: 220 10*3/uL (ref 150.0–400.0)
RBC: 4.99 Mil/uL (ref 4.22–5.81)
RDW: 13.4 % (ref 11.5–15.5)
WBC: 4.1 10*3/uL (ref 4.0–10.5)

## 2015-03-31 LAB — LIPID PANEL
Cholesterol: 135 mg/dL (ref 0–200)
HDL: 38.8 mg/dL — AB (ref >39.00)
LDL Cholesterol: 78 mg/dL (ref 0–99)
NonHDL: 96.2
TRIGLYCERIDES: 89 mg/dL (ref 0.0–149.0)
Total CHOL/HDL Ratio: 3
VLDL: 17.8 mg/dL (ref 0.0–40.0)

## 2015-03-31 LAB — HEMOGLOBIN A1C: Hgb A1c MFr Bld: 6.7 % — ABNORMAL HIGH (ref 4.6–6.5)

## 2015-03-31 LAB — BASIC METABOLIC PANEL
BUN: 9 mg/dL (ref 6–23)
CALCIUM: 9.1 mg/dL (ref 8.4–10.5)
CO2: 28 meq/L (ref 19–32)
Chloride: 105 mEq/L (ref 96–112)
Creatinine, Ser: 0.9 mg/dL (ref 0.40–1.50)
GFR: 117.23 mL/min (ref >60.00)
Glucose, Bld: 126 mg/dL — ABNORMAL HIGH (ref 70–99)
POTASSIUM: 4.1 meq/L (ref 3.5–5.1)
Sodium: 138 mEq/L (ref 135–145)

## 2015-03-31 LAB — HEPATIC FUNCTION PANEL
ALK PHOS: 59 U/L (ref 39–117)
ALT: 27 U/L (ref 0–53)
AST: 21 U/L (ref 0–37)
Albumin: 3.9 g/dL (ref 3.5–5.2)
BILIRUBIN TOTAL: 0.5 mg/dL (ref 0.2–1.2)
Bilirubin, Direct: 0.1 mg/dL (ref 0.0–0.3)
TOTAL PROTEIN: 6.8 g/dL (ref 6.0–8.3)

## 2015-03-31 LAB — PSA: PSA: 0.29 ng/mL (ref 0.10–4.00)

## 2015-03-31 LAB — TSH: TSH: 1.81 u[IU]/mL (ref 0.35–4.50)

## 2015-03-31 NOTE — Assessment & Plan Note (Signed)
New.  Get [place] to assess.  Check labs.  Will determine next steps based on results.

## 2015-03-31 NOTE — Assessment & Plan Note (Signed)
Pt's PE WNL w/ exception of obesity and thyromegaly.  Stressed need for healthy diet and regular exercise.  Check labs.  Anticipatory guidance provided.

## 2015-03-31 NOTE — Progress Notes (Signed)
Pre visit review using our clinic review tool, if applicable. No additional management support is needed unless otherwise documented below in the visit note. 

## 2015-03-31 NOTE — Progress Notes (Signed)
   Subjective:    Patient ID: Christian Henry, male    DOB: 12-Aug-1971, 44 y.o.   MRN: [id number]  HPI CPE- no concerns.   Review of Systems Patient reports no vision/hearing changes, anorexia, fever ,adenopathy, persistant/recurrent hoarseness, swallowing issues, chest pain, palpitations, edema, persistant/recurrent cough, hemoptysis, dyspnea (rest,exertional, paroxysmal nocturnal), gastrointestinal  bleeding (melena, rectal bleeding), abdominal pain, excessive heart burn, GU symptoms (dysuria, hematuria, voiding/incontinence issues) syncope, focal weakness, memory loss, numbness & tingling, skin/hair/nail changes, depression, anxiety, abnormal bruising/bleeding, musculoskeletal symptoms/signs.     Objective:   Physical Exam General Appearance:    Alert, cooperative, no distress, appears stated age, obese  Head:    Normocephalic, without obvious abnormality, atraumatic  Eyes:    PERRL, conjunctiva/corneas clear, EOM's intact, fundi    benign, both eyes       Ears:    Normal TM's and external ear canals, both ears  Nose:   Nares normal, septum midline, mucosa normal, no drainage   or sinus tenderness  Throat:   Lips, mucosa, and tongue normal; teeth and gums normal  Neck:   Supple, symmetrical, trachea midline, no adenopathy;       thyroid:  Generalized thyroid fullness w/o nodularity  Back:     Symmetric, no curvature, ROM normal, no CVA tenderness  Lungs:     Clear to auscultation bilaterally, respirations unlabored  Chest wall:    No tenderness or deformity  Heart:    Regular rate and rhythm, S1 and S2 normal, no murmur, rub   or gallop  Abdomen:     Soft, non-tender, bowel sounds active all four quadrants,    no masses, no organomegaly  Genitalia:    Normal male without lesion, masses,discharge or tenderness  Rectal:    Deferred due to young age  Extremities:   Extremities normal, atraumatic, no cyanosis or edema  Pulses:   2+ and symmetric all extremities  Skin:   Skin color,  texture, turgor normal, no rashes or lesions  Lymph nodes:   Cervical, supraclavicular, and axillary nodes normal  Neurologic:   CNII-XII intact. Normal strength, sensation and reflexes      throughout          Assessment & Plan:

## 2015-03-31 NOTE — Patient Instructions (Signed)
Follow up in 6 months to recheck BP and cholesterol We'll notify you of your lab results and make any changes if needed Try and make healthy food choices and get regular exercise- you can do it! We'll call you with your thyroid ultrasound- it will be downstairs on the 1st floor Call with any questions or concerns Happy Spring!!!

## 2015-04-04 ENCOUNTER — Ambulatory Visit (HOSPITAL_BASED_OUTPATIENT_CLINIC_OR_DEPARTMENT_OTHER): Payer: PRIVATE HEALTH INSURANCE

## 2015-04-20 ENCOUNTER — Encounter: Payer: PRIVATE HEALTH INSURANCE | Admitting: Family Medicine

## 2015-05-11 ENCOUNTER — Other Ambulatory Visit: Payer: Self-pay | Admitting: Family Medicine

## 2015-05-12 ENCOUNTER — Telehealth: Payer: Self-pay | Admitting: Family Medicine

## 2015-05-12 MED ORDER — AMLODIPINE BESYLATE 10 MG PO TABS: ORAL_TABLET | ORAL | Status: DC

## 2015-05-12 MED ORDER — CARVEDILOL 6.25 MG PO TABS: ORAL_TABLET | ORAL | Status: DC

## 2015-05-12 NOTE — Telephone Encounter (Signed)
Med filled.  

## 2015-05-12 NOTE — Telephone Encounter (Signed)
[facility] [address] [place] [phone]

## 2015-05-12 NOTE — Telephone Encounter (Signed)
Caller: Christian Henry, self Ph# [phone]  Pt called in for refills on amLODipine (NORVASC) 10 MG tablet and carvedilol (COREG) 6.25 MG tablet. He states he is out of both of these medications. He wanted to get 1 month supply from local pharmacy and then refills thru [facility]. Call was dropped before he stated local pharmacy to send to.

## 2015-05-20 LAB — HM DIABETES EYE EXAM

## 2015-07-11 ENCOUNTER — Encounter: Payer: Self-pay | Admitting: Family Medicine

## 2015-07-11 ENCOUNTER — Ambulatory Visit (INDEPENDENT_AMBULATORY_CARE_PROVIDER_SITE_OTHER): Payer: PRIVATE HEALTH INSURANCE | Admitting: Family Medicine

## 2015-07-11 VITALS — BP 126/80 | HR 80 | Temp 98.0°F | Resp 16 | Ht 73.0 in | Wt 263.1 lb

## 2015-07-11 DIAGNOSIS — E119 Type 2 diabetes mellitus without complications: Secondary | ICD-10-CM | POA: Diagnosis not present

## 2015-07-11 LAB — BASIC METABOLIC PANEL
BUN: 13 mg/dL (ref 6–23)
CHLORIDE: 103 meq/L (ref 96–112)
CO2: 25 meq/L (ref 19–32)
Calcium: 9.2 mg/dL (ref 8.4–10.5)
Creatinine, Ser: 0.96 mg/dL (ref 0.40–1.50)
GFR: 108.68 mL/min (ref >60.00)
GLUCOSE: 116 mg/dL — AB (ref 70–99)
Potassium: 3.7 mEq/L (ref 3.5–5.1)
Sodium: 136 mEq/L (ref 135–145)

## 2015-07-11 LAB — TSH: TSH: 1.71 u[IU]/mL (ref 0.35–4.50)

## 2015-07-11 LAB — HEMOGLOBIN A1C: HEMOGLOBIN A1C: 5.9 % (ref 4.6–6.5)

## 2015-07-11 NOTE — Patient Instructions (Signed)
Follow up in 3-4 months to recheck sugar, BP, and cholesterol We'll notify you of your lab results and make any changes if needed Keep up the good work!  I'm SOO proud of you!!  You look great!!! Call with any questions or concerns Have a great summer!

## 2015-07-11 NOTE — Progress Notes (Signed)
Pre visit review using our clinic review tool, if applicable. No additional management support is needed unless otherwise documented below in the visit note. 

## 2015-07-11 NOTE — Assessment & Plan Note (Signed)
New dx for pt at last visit.  Pt has taken this very seriously and has made extensive dietary changes and is now getting regular exercise.  Has dropped 20 lbs!  Applauded his efforts.  Check labs.  Adjust tx plan prn.

## 2015-07-11 NOTE — Progress Notes (Signed)
   Subjective:    Patient ID: Christian Henry, male    DOB: Aug 14, 1971, 44 y.o.   MRN: [id number]  HPI Diabetes- new dx. Pt's A1C was 6.7 at last visit.  Pt was to work on healthy diet and regular exercise- has lost 20 lbs!!!  Not on ACE or ARB due to allergy.  Going to gym daily.  Has eliminated all sugary drinks.  On statin daily.  Denies CP, SOB, HAs, visual changes, edema, numbness/tingling of hands/feet.   Review of Systems For ROS see HPI     Objective:   Physical Exam  Constitutional: He is oriented to person, place, and time. He appears well-developed and well-nourished. No distress.  HENT:  Head: Normocephalic and atraumatic.  Eyes: Conjunctivae and EOM are normal. Pupils are equal, round, and reactive to light.  Neck: Normal range of motion. Neck supple. No thyromegaly present.  Cardiovascular: Normal rate, regular rhythm, normal heart sounds and intact distal pulses.   No murmur heard. Pulmonary/Chest: Effort normal and breath sounds normal. No respiratory distress.  Abdominal: Soft. Bowel sounds are normal. He exhibits no distension.  Musculoskeletal: He exhibits no edema.  Lymphadenopathy:    He has no cervical adenopathy.  Neurological: He is alert and oriented to person, place, and time. No cranial nerve deficit.  Skin: Skin is warm and dry.  Psychiatric: He has a normal mood and affect. His behavior is normal.  Vitals reviewed.         Assessment & Plan:

## 2015-08-22 ENCOUNTER — Telehealth: Payer: Self-pay | Admitting: Family Medicine

## 2015-08-22 NOTE — Telephone Encounter (Signed)
Pt wants to stay with Dr. Birdie Riddle and would like to go to [place] office.

## 2015-08-23 NOTE — Telephone Encounter (Signed)
Noted  

## 2015-09-26 ENCOUNTER — Other Ambulatory Visit: Payer: Self-pay | Admitting: Family Medicine

## 2015-09-26 NOTE — Telephone Encounter (Signed)
Medication filled to pharmacy as requested.   

## 2015-10-06 ENCOUNTER — Ambulatory Visit: Payer: PRIVATE HEALTH INSURANCE | Admitting: Family Medicine

## 2015-10-18 ENCOUNTER — Ambulatory Visit: Payer: PRIVATE HEALTH INSURANCE | Admitting: Family Medicine

## 2015-10-20 ENCOUNTER — Encounter (INDEPENDENT_AMBULATORY_CARE_PROVIDER_SITE_OTHER): Payer: Self-pay

## 2015-10-20 ENCOUNTER — Encounter: Payer: Self-pay | Admitting: General Practice

## 2015-10-20 ENCOUNTER — Encounter: Payer: Self-pay | Admitting: Family Medicine

## 2015-10-20 ENCOUNTER — Other Ambulatory Visit: Payer: Self-pay | Admitting: Family Medicine

## 2015-10-20 ENCOUNTER — Ambulatory Visit (INDEPENDENT_AMBULATORY_CARE_PROVIDER_SITE_OTHER): Payer: PRIVATE HEALTH INSURANCE | Admitting: Family Medicine

## 2015-10-20 VITALS — BP 124/80 | HR 70 | Temp 98.0°F | Resp 16 | Ht 73.0 in | Wt 258.4 lb

## 2015-10-20 DIAGNOSIS — E785 Hyperlipidemia, unspecified: Secondary | ICD-10-CM | POA: Diagnosis not present

## 2015-10-20 DIAGNOSIS — E119 Type 2 diabetes mellitus without complications: Secondary | ICD-10-CM | POA: Diagnosis not present

## 2015-10-20 DIAGNOSIS — I10 Essential (primary) hypertension: Secondary | ICD-10-CM

## 2015-10-20 DIAGNOSIS — E1169 Type 2 diabetes mellitus with other specified complication: Secondary | ICD-10-CM | POA: Diagnosis not present

## 2015-10-20 LAB — BASIC METABOLIC PANEL
BUN: 14 mg/dL (ref 6–23)
CHLORIDE: 105 meq/L (ref 96–112)
CO2: 29 meq/L (ref 19–32)
CREATININE: 0.98 mg/dL (ref 0.40–1.50)
Calcium: 9.3 mg/dL (ref 8.4–10.5)
GFR: 106 mL/min (ref >60.00)
Glucose, Bld: 94 mg/dL (ref 70–99)
Potassium: 4.1 mEq/L (ref 3.5–5.1)
Sodium: 141 mEq/L (ref 135–145)

## 2015-10-20 LAB — CBC WITH DIFFERENTIAL/PLATELET
BASOS PCT: 0.5 % (ref 0.0–3.0)
Basophils Absolute: 0 10*3/uL (ref 0.0–0.1)
EOS ABS: 0.1 10*3/uL (ref 0.0–0.7)
Eosinophils Relative: 3 % (ref 0.0–5.0)
HCT: 40.1 % (ref 39.0–52.0)
HEMOGLOBIN: 13.1 g/dL (ref 13.0–17.0)
Lymphocytes Relative: 37.8 % (ref 12.0–46.0)
Lymphs Abs: 1.9 10*3/uL (ref 0.7–4.0)
MCHC: 32.6 g/dL (ref 30.0–36.0)
MCV: 83.3 fl (ref 78.0–100.0)
MONO ABS: 0.5 10*3/uL (ref 0.1–1.0)
Monocytes Relative: 9.7 % (ref 3.0–12.0)
NEUTROS ABS: 2.5 10*3/uL (ref 1.4–7.7)
NEUTROS PCT: 49 % (ref 43.0–77.0)
PLATELETS: 220 10*3/uL (ref 150.0–400.0)
RBC: 4.81 Mil/uL (ref 4.22–5.81)
RDW: 13.4 % (ref 11.5–15.5)
WBC: 5 10*3/uL (ref 4.0–10.5)

## 2015-10-20 LAB — MICROALBUMIN / CREATININE URINE RATIO
CREATININE, U: 368.1 mg/dL
MICROALB UR: 1.2 mg/dL (ref 0.0–1.9)
Microalb Creat Ratio: 0.3 mg/g (ref 0.0–30.0)

## 2015-10-20 LAB — LIPID PANEL
CHOLESTEROL: 118 mg/dL (ref 0–200)
HDL: 33.4 mg/dL — AB (ref >39.00)
LDL Cholesterol: 74 mg/dL (ref 0–99)
NONHDL: 84.1
TRIGLYCERIDES: 53 mg/dL (ref 0.0–149.0)
Total CHOL/HDL Ratio: 4
VLDL: 10.6 mg/dL (ref 0.0–40.0)

## 2015-10-20 LAB — HEMOGLOBIN A1C: HEMOGLOBIN A1C: 6.1 % (ref 4.6–6.5)

## 2015-10-20 LAB — HEPATIC FUNCTION PANEL
ALT: 22 U/L (ref 0–53)
AST: 19 U/L (ref 0–37)
Albumin: 4.1 g/dL (ref 3.5–5.2)
Alkaline Phosphatase: 52 U/L (ref 39–117)
BILIRUBIN DIRECT: 0.1 mg/dL (ref 0.0–0.3)
TOTAL PROTEIN: 7.4 g/dL (ref 6.0–8.3)
Total Bilirubin: 0.6 mg/dL (ref 0.2–1.2)

## 2015-10-20 LAB — TSH: TSH: 1.02 u[IU]/mL (ref 0.35–4.50)

## 2015-10-20 NOTE — Telephone Encounter (Signed)
Medication filled to pharmacy as requested.   

## 2015-10-20 NOTE — Progress Notes (Signed)
   Subjective:    Patient ID: Christian Henry, male    DOB: 10/30/1971, 44 y.o.   MRN: [id number]  HPI DM- new problem for pt, attempting to control w/ healthy diet, regular exercise.  Pt has lost another 5lbs.  Pt is exercising regularly, has changed diet.  Last eye exam 4 months ago- everything WNL.  No numbness/tingling of hands/feet.  HTN- chronic problem, on Amlodipine, Coreg.  No CP, SOB, HAs, visual changes, edema.  Hyperlipidemia- chronic problem, on Lipitor.  Denies abd pain, N/V, myalgias.   Review of Systems For ROS see HPI     Objective:   Physical Exam  Constitutional: He is oriented to person, place, and time. He appears well-developed and well-nourished. No distress.  obese  HENT:  Head: Normocephalic and atraumatic.  Eyes: Conjunctivae and EOM are normal. Pupils are equal, round, and reactive to light.  Neck: Normal range of motion. Neck supple. No thyromegaly present.  Cardiovascular: Normal rate, regular rhythm, normal heart sounds and intact distal pulses.   No murmur heard. Pulmonary/Chest: Effort normal and breath sounds normal. No respiratory distress.  Abdominal: Soft. Bowel sounds are normal. He exhibits no distension.  Musculoskeletal: He exhibits no edema.  Lymphadenopathy:    He has no cervical adenopathy.  Neurological: He is alert and oriented to person, place, and time. No cranial nerve deficit.  Skin: Skin is warm and dry.  Psychiatric: He has a normal mood and affect. His behavior is normal.  Vitals reviewed.         Assessment & Plan:

## 2015-10-20 NOTE — Assessment & Plan Note (Signed)
Chronic problem.  Well controlled.  Asymptomatic.  Check labs.  No anticipated med changes. 

## 2015-10-20 NOTE — Patient Instructions (Signed)
Schedule your complete physical after 4/1 We'll notify you of your lab results and make any changes if needed Keep up the good work on healthy diet and regular exercise- you look great! Call with any questions or concerns If you want to join [place] at the new [place] office, any scheduled appointments will automatically transfer and we will see you at [address] [place] [address] [place], [place], [place] [address]  Talk with your wife- this is definitely fixable!!! Happy Fall!!!

## 2015-10-20 NOTE — Assessment & Plan Note (Signed)
Pt continues to lose weight w/ healthy diet and regular exercise.  UTD on eye exam, foot exam.  Not on any medication at this time.  Currently asymptomatic.  Check labs- including microalbumin.  Applauded his efforts.  Will continue to follow closely.

## 2015-10-20 NOTE — Progress Notes (Signed)
Pre visit review using our clinic review tool, if applicable. No additional management support is needed unless otherwise documented below in the visit note. 

## 2015-10-20 NOTE — Assessment & Plan Note (Signed)
Chronic problem.  Tolerating statin w/o difficulty.  Check labs.  Adjust meds prn  

## 2016-01-14 ENCOUNTER — Other Ambulatory Visit: Payer: Self-pay | Admitting: Family Medicine

## 2016-04-03 ENCOUNTER — Encounter: Payer: PRIVATE HEALTH INSURANCE | Admitting: Family Medicine

## 2016-04-12 ENCOUNTER — Other Ambulatory Visit: Payer: Self-pay | Admitting: Family Medicine

## 2016-04-15 ENCOUNTER — Other Ambulatory Visit: Payer: Self-pay | Admitting: Family Medicine

## 2016-04-15 NOTE — Telephone Encounter (Signed)
Medication filled to pharmacy as requested.   

## 2016-05-09 ENCOUNTER — Ambulatory Visit (INDEPENDENT_AMBULATORY_CARE_PROVIDER_SITE_OTHER): Payer: PRIVATE HEALTH INSURANCE | Admitting: Family Medicine

## 2016-05-09 ENCOUNTER — Encounter: Payer: Self-pay | Admitting: Family Medicine

## 2016-05-09 VITALS — BP 120/82 | HR 76 | Temp 98.0°F | Resp 16 | Ht 73.0 in | Wt 258.0 lb

## 2016-05-09 DIAGNOSIS — Z0000 Encounter for general adult medical examination without abnormal findings: Secondary | ICD-10-CM | POA: Diagnosis not present

## 2016-05-09 DIAGNOSIS — Z23 Encounter for immunization: Secondary | ICD-10-CM | POA: Diagnosis not present

## 2016-05-09 DIAGNOSIS — E119 Type 2 diabetes mellitus without complications: Secondary | ICD-10-CM | POA: Diagnosis not present

## 2016-05-09 LAB — CBC WITH DIFFERENTIAL/PLATELET
BASOS ABS: 0 {cells}/uL (ref 0–200)
Basophils Relative: 0 %
EOS ABS: 205 {cells}/uL (ref 15–500)
Eosinophils Relative: 5 %
HEMATOCRIT: 41.2 % (ref 38.5–50.0)
Hemoglobin: 13.7 g/dL (ref 13.2–17.1)
LYMPHS PCT: 44 %
Lymphs Abs: 1804 cells/uL (ref 850–3900)
MCH: 27.4 pg (ref 27.0–33.0)
MCHC: 33.3 g/dL (ref 32.0–36.0)
MCV: 82.4 fL (ref 80.0–100.0)
MONO ABS: 410 {cells}/uL (ref 200–950)
MPV: 8.9 fL (ref 7.5–12.5)
Monocytes Relative: 10 %
NEUTROS PCT: 41 %
Neutro Abs: 1681 cells/uL (ref 1500–7800)
Platelets: 209 10*3/uL (ref 140–400)
RBC: 5 MIL/uL (ref 4.20–5.80)
RDW: 13.7 % (ref 11.0–15.0)
WBC: 4.1 10*3/uL (ref 3.8–10.8)

## 2016-05-09 LAB — HEMOGLOBIN A1C
Hgb A1c MFr Bld: 6.1 % — ABNORMAL HIGH (ref <5.7)
MEAN PLASMA GLUCOSE: 128 mg/dL

## 2016-05-09 LAB — HEPATIC FUNCTION PANEL
ALT: 28 U/L (ref 9–46)
AST: 25 U/L (ref 10–40)
Albumin: 4.3 g/dL (ref 3.6–5.1)
Alkaline Phosphatase: 50 U/L (ref 40–115)
BILIRUBIN DIRECT: 0.2 mg/dL (ref <=0.2)
BILIRUBIN INDIRECT: 0.5 mg/dL (ref 0.2–1.2)
Total Bilirubin: 0.7 mg/dL (ref 0.2–1.2)
Total Protein: 7.1 g/dL (ref 6.1–8.1)

## 2016-05-09 LAB — TSH: TSH: 2.1 mIU/L (ref 0.40–4.50)

## 2016-05-09 LAB — BASIC METABOLIC PANEL
BUN: 11 mg/dL (ref 7–25)
CHLORIDE: 105 mmol/L (ref 98–110)
CO2: 27 mmol/L (ref 20–31)
CREATININE: 0.91 mg/dL (ref 0.60–1.35)
Calcium: 9.1 mg/dL (ref 8.6–10.3)
Glucose, Bld: 92 mg/dL (ref 65–99)
Potassium: 3.9 mmol/L (ref 3.5–5.3)
Sodium: 139 mmol/L (ref 135–146)

## 2016-05-09 LAB — LIPID PANEL
CHOL/HDL RATIO: 3.2 ratio (ref <=5.0)
Cholesterol: 132 mg/dL (ref 125–200)
HDL: 41 mg/dL (ref >=40)
LDL Cholesterol: 79 mg/dL (ref <130)
Triglycerides: 62 mg/dL (ref <150)
VLDL: 12 mg/dL (ref <30)

## 2016-05-09 NOTE — Patient Instructions (Signed)
Follow up in 6 months to recheck BP, cholesterol, and diabetes We'll notify you of your lab results and make any changes if needed Keep up the good work on healthy diet and regular exercise- you look great! Call with any questions or concerns Thanks for sticking with [place]! Have a Great Summer!!!

## 2016-05-09 NOTE — Progress Notes (Signed)
   Subjective:    Patient ID: Christian Henry, male    DOB: 1971/11/16, 45 y.o.   MRN: [id number]  HPI CPE- UTD on foot exam, UTD on eye exam (just done this month).  exercising regularly, has completely changed diet.   Review of Systems Patient reports no vision/hearing changes, anorexia, fever ,adenopathy, persistant/recurrent hoarseness, swallowing issues, chest pain, palpitations, edema, persistant/recurrent cough, hemoptysis, dyspnea (rest,exertional, paroxysmal nocturnal), gastrointestinal  bleeding (melena, rectal bleeding), abdominal pain, excessive heart burn, GU symptoms (dysuria, hematuria, voiding/incontinence issues) syncope, focal weakness, memory loss, numbness & tingling, skin/hair/nail changes, depression, anxiety, abnormal bruising/bleeding, musculoskeletal symptoms/signs.      Objective:   Physical Exam General Appearance:    Alert, cooperative, no distress, appears stated age  Head:    Normocephalic, without obvious abnormality, atraumatic  Eyes:    PERRL, conjunctiva/corneas clear, EOM's intact, fundi    benign, both eyes       Ears:    Normal TM's and external ear canals, both ears  Nose:   Nares normal, septum midline, mucosa normal, no drainage   or sinus tenderness  Throat:   Lips, mucosa, and tongue normal; teeth and gums normal  Neck:   Supple, symmetrical, trachea midline, no adenopathy;       thyroid:  No enlargement/tenderness/nodules  Back:     Symmetric, no curvature, ROM normal, no CVA tenderness  Lungs:     Clear to auscultation bilaterally, respirations unlabored  Chest wall:    No tenderness or deformity  Heart:    Regular rate and rhythm, S1 and S2 normal, no murmur, rub   or gallop  Abdomen:     Soft, non-tender, bowel sounds active all four quadrants,    no masses, no organomegaly  Genitalia:    Normal male without lesion, masses,discharge or tenderness  Rectal:    Deferred due to young age  Extremities:   Extremities normal, atraumatic, no cyanosis  or edema  Pulses:   2+ and symmetric all extremities  Skin:   Skin color, texture, turgor normal, no rashes or lesions  Lymph nodes:   Cervical, supraclavicular, and axillary nodes normal  Neurologic:   CNII-XII intact. Normal strength, sensation and reflexes      throughout          Assessment & Plan:

## 2016-05-09 NOTE — Progress Notes (Signed)
Pre visit review using our clinic review tool, if applicable. No additional management support is needed unless otherwise documented below in the visit note. 

## 2016-05-10 ENCOUNTER — Encounter: Payer: Self-pay | Admitting: General Practice

## 2016-05-10 LAB — PSA: PSA: 0.6 ng/mL (ref <=4.00)

## 2016-05-12 NOTE — Assessment & Plan Note (Signed)
Chronic problem.  Diet controlled.  UTD on eye exam, foot exam, microalbumin.  Stressed need for healthy diet and regular exercise.

## 2016-05-12 NOTE — Assessment & Plan Note (Signed)
Pt's PE WNL.  Check labs.  Anticipatory guidance provided.  

## 2016-05-18 LAB — HM DIABETES EYE EXAM

## 2016-05-23 ENCOUNTER — Telehealth: Payer: Self-pay | Admitting: Family Medicine

## 2016-05-23 ENCOUNTER — Ambulatory Visit (INDEPENDENT_AMBULATORY_CARE_PROVIDER_SITE_OTHER): Payer: PRIVATE HEALTH INSURANCE | Admitting: Family Medicine

## 2016-05-23 ENCOUNTER — Other Ambulatory Visit: Payer: Self-pay | Admitting: General Practice

## 2016-05-23 ENCOUNTER — Encounter (HOSPITAL_COMMUNITY): Payer: Self-pay

## 2016-05-23 ENCOUNTER — Ambulatory Visit (HOSPITAL_COMMUNITY)
Admission: RE | Admit: 2016-05-23 | Discharge: 2016-05-23 | Disposition: A | Discharge status: Home / Self Care | Payer: PRIVATE HEALTH INSURANCE | Source: Ambulatory Visit | Attending: Family Medicine | Admitting: Family Medicine

## 2016-05-23 ENCOUNTER — Encounter: Payer: Self-pay | Admitting: Family Medicine

## 2016-05-23 VITALS — BP 138/88 | HR 82 | Temp 98.3°F | Resp 16 | Wt 256.4 lb

## 2016-05-23 DIAGNOSIS — K469 Unspecified abdominal hernia without obstruction or gangrene: Secondary | ICD-10-CM | POA: Diagnosis not present

## 2016-05-23 DIAGNOSIS — R1084 Generalized abdominal pain: Secondary | ICD-10-CM

## 2016-05-23 DIAGNOSIS — R109 Unspecified abdominal pain: Secondary | ICD-10-CM | POA: Diagnosis not present

## 2016-05-23 DIAGNOSIS — G8929 Other chronic pain: Secondary | ICD-10-CM | POA: Diagnosis present

## 2016-05-23 LAB — HEPATIC FUNCTION PANEL
ALT: 23 U/L (ref 0–53)
AST: 24 U/L (ref 0–37)
Albumin: 4.3 g/dL (ref 3.5–5.2)
Alkaline Phosphatase: 54 U/L (ref 39–117)
Bilirubin, Direct: 0.1 mg/dL (ref 0.0–0.3)
Total Bilirubin: 0.6 mg/dL (ref 0.2–1.2)
Total Protein: 7 g/dL (ref 6.0–8.3)

## 2016-05-23 LAB — BASIC METABOLIC PANEL
BUN: 18 mg/dL (ref 6–23)
CHLORIDE: 103 meq/L (ref 96–112)
CO2: 33 meq/L — AB (ref 19–32)
CREATININE: 1 mg/dL (ref 0.40–1.50)
Calcium: 9.5 mg/dL (ref 8.4–10.5)
GFR: 103.28 mL/min (ref >60.00)
Glucose, Bld: 101 mg/dL — ABNORMAL HIGH (ref 70–99)
Potassium: 4.5 mEq/L (ref 3.5–5.1)
Sodium: 141 mEq/L (ref 135–145)

## 2016-05-23 LAB — CBC WITH DIFFERENTIAL/PLATELET
BASOS ABS: 0 10*3/uL (ref 0.0–0.1)
Basophils Relative: 0.7 % (ref 0.0–3.0)
EOS ABS: 0.2 10*3/uL (ref 0.0–0.7)
Eosinophils Relative: 3 % (ref 0.0–5.0)
HEMATOCRIT: 39.3 % (ref 39.0–52.0)
HEMOGLOBIN: 12.9 g/dL — AB (ref 13.0–17.0)
LYMPHS PCT: 30.8 % (ref 12.0–46.0)
Lymphs Abs: 1.7 10*3/uL (ref 0.7–4.0)
MCHC: 32.7 g/dL (ref 30.0–36.0)
MCV: 82.8 fl (ref 78.0–100.0)
Monocytes Absolute: 0.6 10*3/uL (ref 0.1–1.0)
Monocytes Relative: 10.7 % (ref 3.0–12.0)
Neutro Abs: 3 10*3/uL (ref 1.4–7.7)
Neutrophils Relative %: 54.8 % (ref 43.0–77.0)
Platelets: 225 10*3/uL (ref 150.0–400.0)
RBC: 4.74 Mil/uL (ref 4.22–5.81)
RDW: 13 % (ref 11.5–15.5)
WBC: 5.5 10*3/uL (ref 4.0–10.5)

## 2016-05-23 LAB — LIPASE: Lipase: 43 U/L (ref 11.0–59.0)

## 2016-05-23 MED ORDER — PANTOPRAZOLE SODIUM 40 MG PO TBEC: 40.0000 mg | DELAYED_RELEASE_TABLET | Freq: Every day | ORAL | Status: DC

## 2016-05-23 MED ORDER — IOPAMIDOL (ISOVUE-300) INJECTION 61%
100.0000 mL | Freq: Once | INTRAVENOUS | Status: AC | PRN
  Administered 2016-05-23: 100 mL via INTRAVENOUS

## 2016-05-23 MED ORDER — DIATRIZOATE MEGLUMINE & SODIUM 66-10 % PO SOLN: 30.0000 mL | Freq: Once | ORAL | Status: DC

## 2016-05-23 MED ORDER — HYDROCODONE-ACETAMINOPHEN 5-325 MG PO TABS: 1.0000 | ORAL_TABLET | Freq: Four times a day (QID) | ORAL | Status: DC | PRN

## 2016-05-23 NOTE — Assessment & Plan Note (Signed)
New.  Pt's pain is severe.  + voluntary guarding.  Pain is most severe in epigastrum, RUQ and slightly less in LUQ.  No TTP over lower quadrants.  Concern for pancreatitis, colitis, diverticulitis, or biliary abnormality.  Pt does not usually complain and he is near tears.  Get labs.  Stat CT to assess.  Hydrocodone for pain.  Reviewed supportive care and red flags that should prompt return.  Pt expressed understanding and is in agreement w/ plan.

## 2016-05-23 NOTE — Progress Notes (Signed)
Pre visit review using our clinic review tool, if applicable. No additional management support is needed unless otherwise documented below in the visit note. 

## 2016-05-23 NOTE — Patient Instructions (Signed)
We'll notify you of your CT and lab results Avoid eating or drinking until after your CT Use the Hydrocodone as needed We'll determine the next steps once we have the results Hang in there!!!

## 2016-05-23 NOTE — Progress Notes (Signed)
   Subjective:    Patient ID: Christian Henry, male    DOB: 05-Feb-1971, 45 y.o.   MRN: [id number]  HPI abd pain- sxs started ~3 days ago.  Pt initially thought it was gas pain b/c he hasn't been burping or passing gas.  Having pain that 'moves around the front' and 'in the center of my back'.  Has used Phazyme and baking soda/water w/o relief.  No nausea.  Able to eat w/o difficulty.  'slight' constipation.  abd pain is not localized.  Pain described as a strong cramp.  No fevers.  No dysuria, no hematuria.   Review of Systems For ROS see HPI     Objective:   Physical Exam  Constitutional: He is oriented to person, place, and time. He appears well-developed and well-nourished.  Obviously uncomfortable  HENT:  Head: Normocephalic and atraumatic.  Cardiovascular: Normal rate, regular rhythm and normal heart sounds.   Pulmonary/Chest: Effort normal and breath sounds normal. No respiratory distress. He has no wheezes. He has no rales.  Abdominal: Bowel sounds are normal. He exhibits distension (mild). There is tenderness (TTP over epigastrum, and bilateral upper quadrants, no TTP over lower quadrants). There is guarding (voluntary guarding). There is no rebound.  Musculoskeletal: He exhibits no tenderness (no TTP over lumbar spine or paraspinal muscles).  Neurological: He is alert and oriented to person, place, and time.  Skin: Skin is warm and dry. No erythema.  Vitals reviewed.         Assessment & Plan:

## 2016-05-23 NOTE — Telephone Encounter (Signed)
Pt came to office and was informed of results by PCP.

## 2016-05-23 NOTE — Telephone Encounter (Signed)
Pt calling to get results of CT performed today.

## 2016-07-14 ENCOUNTER — Other Ambulatory Visit: Payer: Self-pay | Admitting: Family Medicine

## 2016-07-15 NOTE — Telephone Encounter (Signed)
Medication filled to pharmacy as requested.   

## 2016-11-13 ENCOUNTER — Ambulatory Visit: Payer: Self-pay | Admitting: Family Medicine

## 2016-11-14 ENCOUNTER — Encounter: Payer: Self-pay | Admitting: Family Medicine

## 2016-11-14 ENCOUNTER — Ambulatory Visit (INDEPENDENT_AMBULATORY_CARE_PROVIDER_SITE_OTHER): Payer: 59 | Admitting: Family Medicine

## 2016-11-14 VITALS — BP 125/82 | HR 58 | Temp 97.9°F | Resp 16 | Ht 73.0 in | Wt 264.4 lb

## 2016-11-14 DIAGNOSIS — E119 Type 2 diabetes mellitus without complications: Secondary | ICD-10-CM

## 2016-11-14 DIAGNOSIS — I10 Essential (primary) hypertension: Secondary | ICD-10-CM | POA: Diagnosis not present

## 2016-11-14 DIAGNOSIS — E785 Hyperlipidemia, unspecified: Secondary | ICD-10-CM

## 2016-11-14 DIAGNOSIS — E1169 Type 2 diabetes mellitus with other specified complication: Secondary | ICD-10-CM | POA: Diagnosis not present

## 2016-11-14 LAB — CBC WITH DIFFERENTIAL/PLATELET
BASOS PCT: 0.7 % (ref 0.0–3.0)
Basophils Absolute: 0 10*3/uL (ref 0.0–0.1)
EOS PCT: 4.7 % (ref 0.0–5.0)
Eosinophils Absolute: 0.2 10*3/uL (ref 0.0–0.7)
HEMATOCRIT: 40.9 % (ref 39.0–52.0)
HEMOGLOBIN: 13.7 g/dL (ref 13.0–17.0)
Lymphocytes Relative: 42.4 % (ref 12.0–46.0)
Lymphs Abs: 2 10*3/uL (ref 0.7–4.0)
MCHC: 33.5 g/dL (ref 30.0–36.0)
MCV: 83.1 fl (ref 78.0–100.0)
MONO ABS: 0.5 10*3/uL (ref 0.1–1.0)
MONOS PCT: 11.7 % (ref 3.0–12.0)
Neutro Abs: 1.9 10*3/uL (ref 1.4–7.7)
Neutrophils Relative %: 40.5 % — ABNORMAL LOW (ref 43.0–77.0)
Platelets: 205 10*3/uL (ref 150.0–400.0)
RBC: 4.92 Mil/uL (ref 4.22–5.81)
RDW: 13.3 % (ref 11.5–15.5)
WBC: 4.7 10*3/uL (ref 4.0–10.5)

## 2016-11-14 LAB — HEPATIC FUNCTION PANEL
ALBUMIN: 4.2 g/dL (ref 3.5–5.2)
ALT: 24 U/L (ref 0–53)
AST: 20 U/L (ref 0–37)
Alkaline Phosphatase: 56 U/L (ref 39–117)
BILIRUBIN TOTAL: 0.6 mg/dL (ref 0.2–1.2)
Bilirubin, Direct: 0.1 mg/dL (ref 0.0–0.3)
Total Protein: 6.9 g/dL (ref 6.0–8.3)

## 2016-11-14 LAB — LIPID PANEL
CHOL/HDL RATIO: 3
Cholesterol: 127 mg/dL (ref 0–200)
HDL: 43.8 mg/dL (ref >39.00)
LDL CALC: 73 mg/dL (ref 0–99)
NONHDL: 82.84
Triglycerides: 48 mg/dL (ref 0.0–149.0)
VLDL: 9.6 mg/dL (ref 0.0–40.0)

## 2016-11-14 LAB — BASIC METABOLIC PANEL
BUN: 13 mg/dL (ref 6–23)
CHLORIDE: 106 meq/L (ref 96–112)
CO2: 28 mEq/L (ref 19–32)
CREATININE: 0.94 mg/dL (ref 0.40–1.50)
Calcium: 9.2 mg/dL (ref 8.4–10.5)
GFR: 110.7 mL/min (ref >60.00)
Glucose, Bld: 116 mg/dL — ABNORMAL HIGH (ref 70–99)
POTASSIUM: 4.1 meq/L (ref 3.5–5.1)
Sodium: 141 mEq/L (ref 135–145)

## 2016-11-14 LAB — TSH: TSH: 1.75 u[IU]/mL (ref 0.35–4.50)

## 2016-11-14 LAB — HEMOGLOBIN A1C: Hgb A1c MFr Bld: 6.2 % (ref 4.6–6.5)

## 2016-11-14 NOTE — Progress Notes (Signed)
   Subjective:    Patient ID: Christian Henry, male    DOB: [birth year], 45 y.o.   MRN: [id number]  HPI HTN- chronic problem, on Amlodipine, Coreg.  No CP, SOB, HAs, visual changes, edema  Hyperlipidemia- chronic problem, on Lipitor.  No abd pain, N/V,  DM- chronic problem, currently diet controlled but pt has gained 10 lbs since last visit.  UTD on eye exam.  Due for microalbumin, foot exam.  Pt is allergic to ACE/ARB.  Pt is walking/running 4x/week.  No numbness/tingling of hands/feet   Review of Systems For ROS see HPI     Objective:   Physical Exam  Constitutional: He is oriented to person, place, and time. He appears well-developed and well-nourished. No distress.  HENT:  Head: Normocephalic and atraumatic.  Eyes: Conjunctivae and EOM are normal. Pupils are equal, round, and reactive to light.  Neck: Normal range of motion. Neck supple. No thyromegaly present.  Cardiovascular: Normal rate, regular rhythm, normal heart sounds and intact distal pulses.   No murmur heard. Pulmonary/Chest: Effort normal and breath sounds normal. No respiratory distress.  Abdominal: Soft. Bowel sounds are normal. He exhibits no distension.  Musculoskeletal: He exhibits no edema.  Lymphadenopathy:    He has no cervical adenopathy.  Neurological: He is alert and oriented to person, place, and time. No cranial nerve deficit.  Skin: Skin is warm and dry.  Psychiatric: He has a normal mood and affect. His behavior is normal.  Vitals reviewed.         Assessment & Plan:

## 2016-11-14 NOTE — Assessment & Plan Note (Signed)
Chronic problem.  Tolerating statin w/o difficulty.  Applauded his efforts on diet and exercise.  Check labs.  Adjust meds prn

## 2016-11-14 NOTE — Progress Notes (Signed)
Pre visit review using our clinic review tool, if applicable. No additional management support is needed unless otherwise documented below in the visit note. 

## 2016-11-14 NOTE — Assessment & Plan Note (Signed)
Chronic problem, well controlled today on current meds.  Asymptomatic.  Not able to take ACE/ARB.  No anticipated med changes.

## 2016-11-14 NOTE — Assessment & Plan Note (Signed)
Chronic problem.  Pt is frustrated by his weight gain despite his exercise program and low carb diet.  Currently asymptomatic.  No numbness/tingling, UTD on eye exam.  Foot exam done today.  Check labs.  Start meds prn.

## 2016-11-14 NOTE — Patient Instructions (Signed)
Schedule your complete physical in 6 months We'll notify you of your lab results and make any changes if needed Continue to work on healthy diet and regular exercise- you can do it! Call with any questions or concerns Happy Holidays!! 

## 2016-11-15 ENCOUNTER — Encounter: Payer: Self-pay | Admitting: General Practice

## 2017-01-11 ENCOUNTER — Other Ambulatory Visit: Payer: Self-pay | Admitting: Family Medicine

## 2017-05-05 LAB — HM DIABETES EYE EXAM

## 2017-05-12 ENCOUNTER — Ambulatory Visit (INDEPENDENT_AMBULATORY_CARE_PROVIDER_SITE_OTHER): Payer: 59 | Admitting: Family Medicine

## 2017-05-12 ENCOUNTER — Encounter: Payer: Self-pay | Admitting: Family Medicine

## 2017-05-12 VITALS — BP 126/80 | HR 58 | Temp 98.1°F | Resp 16 | Ht 73.0 in | Wt 244.1 lb

## 2017-05-12 DIAGNOSIS — Z0000 Encounter for general adult medical examination without abnormal findings: Secondary | ICD-10-CM | POA: Diagnosis not present

## 2017-05-12 DIAGNOSIS — R61 Generalized hyperhidrosis: Secondary | ICD-10-CM | POA: Diagnosis not present

## 2017-05-12 DIAGNOSIS — E119 Type 2 diabetes mellitus without complications: Secondary | ICD-10-CM

## 2017-05-12 LAB — CBC WITH DIFFERENTIAL/PLATELET
BASOS ABS: 0.1 10*3/uL (ref 0.0–0.1)
Basophils Relative: 1.1 % (ref 0.0–3.0)
EOS PCT: 5.4 % — AB (ref 0.0–5.0)
Eosinophils Absolute: 0.2 10*3/uL (ref 0.0–0.7)
HCT: 39.5 % (ref 39.0–52.0)
HEMOGLOBIN: 13.1 g/dL (ref 13.0–17.0)
Lymphocytes Relative: 41.6 % (ref 12.0–46.0)
Lymphs Abs: 1.9 10*3/uL (ref 0.7–4.0)
MCHC: 33.3 g/dL (ref 30.0–36.0)
MCV: 84.1 fl (ref 78.0–100.0)
MONO ABS: 0.5 10*3/uL (ref 0.1–1.0)
MONOS PCT: 11.3 % (ref 3.0–12.0)
NEUTROS PCT: 40.6 % — AB (ref 43.0–77.0)
Neutro Abs: 1.8 10*3/uL (ref 1.4–7.7)
Platelets: 218 10*3/uL (ref 150.0–400.0)
RBC: 4.7 Mil/uL (ref 4.22–5.81)
RDW: 13.5 % (ref 11.5–15.5)
WBC: 4.5 10*3/uL (ref 4.0–10.5)

## 2017-05-12 LAB — BASIC METABOLIC PANEL
BUN: 14 mg/dL (ref 6–23)
CALCIUM: 9.2 mg/dL (ref 8.4–10.5)
CO2: 29 mEq/L (ref 19–32)
CREATININE: 0.89 mg/dL (ref 0.40–1.50)
Chloride: 105 mEq/L (ref 96–112)
GFR: 117.66 mL/min (ref >60.00)
Glucose, Bld: 94 mg/dL (ref 70–99)
Potassium: 4.5 mEq/L (ref 3.5–5.1)
Sodium: 140 mEq/L (ref 135–145)

## 2017-05-12 LAB — HEPATIC FUNCTION PANEL
ALBUMIN: 4.2 g/dL (ref 3.5–5.2)
ALT: 18 U/L (ref 0–53)
AST: 17 U/L (ref 0–37)
Alkaline Phosphatase: 50 U/L (ref 39–117)
Bilirubin, Direct: 0.1 mg/dL (ref 0.0–0.3)
TOTAL PROTEIN: 6.9 g/dL (ref 6.0–8.3)
Total Bilirubin: 0.5 mg/dL (ref 0.2–1.2)

## 2017-05-12 LAB — LIPID PANEL
CHOL/HDL RATIO: 2
Cholesterol: 125 mg/dL (ref 0–200)
HDL: 52.4 mg/dL (ref >39.00)
LDL Cholesterol: 65 mg/dL (ref 0–99)
NONHDL: 73.05
Triglycerides: 41 mg/dL (ref 0.0–149.0)
VLDL: 8.2 mg/dL (ref 0.0–40.0)

## 2017-05-12 LAB — MICROALBUMIN / CREATININE URINE RATIO
CREATININE, U: 133.6 mg/dL
MICROALB/CREAT RATIO: 0.5 mg/g (ref 0.0–30.0)

## 2017-05-12 LAB — HEMOGLOBIN A1C: HEMOGLOBIN A1C: 6 % (ref 4.6–6.5)

## 2017-05-12 LAB — TSH: TSH: 0.73 u[IU]/mL (ref 0.35–4.50)

## 2017-05-12 LAB — PSA: PSA: 0.63 ng/mL (ref 0.10–4.00)

## 2017-05-12 NOTE — Patient Instructions (Signed)
Follow up in 6 months to recheck BP, cholesterol, and diabetes We'll notify you of your lab results and make any changes if needed Keep up the good work on healthy diet and regular exercise- you look great!!! Go to the [facility] and get your chest xray to assess night sweats Call with any questions or concerns Have a great summer!!!

## 2017-05-12 NOTE — Assessment & Plan Note (Signed)
Pt's PE WNL.  UTD on Tdap, eye exam, foot exam.  Applauded his recent weight loss.  Check labs.  Anticipatory guidance provided.

## 2017-05-12 NOTE — Assessment & Plan Note (Signed)
Chronic problem.  Hx of excellent control.  UTD on foot exam, eye exam.  Due for microalbumin.  Check labs.  Adjust tx plan prn.

## 2017-05-12 NOTE — Progress Notes (Signed)
Pre visit review using our clinic review tool, if applicable. No additional management support is needed unless otherwise documented below in the visit note. 

## 2017-05-12 NOTE — Progress Notes (Signed)
   Subjective:    Patient ID: Christian Henry, male    DOB: [birth year], 46 y.o.   MRN: [id number]  HPI CPE- UTD on eye exam, foot exam, Tdap.  Too young for colonoscopy.     Review of Systems Patient reports no vision/hearing changes, anorexia, fever ,adenopathy, persistant/recurrent hoarseness, swallowing issues, chest pain, palpitations, edema, persistant/recurrent cough, hemoptysis, dyspnea (rest,exertional, paroxysmal nocturnal), gastrointestinal  bleeding (melena, rectal bleeding), abdominal pain, excessive heart burn, GU symptoms (dysuria, hematuria, voiding/incontinence issues) syncope, focal weakness, memory loss, numbness & tingling, skin/hair/nail changes, depression, anxiety, abnormal bruising/bleeding, musculoskeletal symptoms/signs.     Objective:   Physical Exam BP 126/80   Pulse (!) 58   Temp 98.1 F (36.7 C) (Oral)   Resp 16   Ht 6\' 1"  (1.854 m)   Wt 244 lb 2 oz (110.7 kg)   SpO2 98%   BMI 32.21 kg/m   General Appearance:    Alert, cooperative, no distress, appears stated age  Head:    Normocephalic, without obvious abnormality, atraumatic  Eyes:    PERRL, conjunctiva/corneas clear, EOM's intact, fundi    benign, both eyes       Ears:    Normal TM's and external ear canals, both ears  Nose:   Nares normal, septum midline, mucosa normal, no drainage   or sinus tenderness  Throat:   Lips, mucosa, and tongue normal; teeth and gums normal  Neck:   Supple, symmetrical, trachea midline, no adenopathy;       thyroid:  No enlargement/tenderness/nodules  Back:     Symmetric, no curvature, ROM normal, no CVA tenderness  Lungs:     Clear to auscultation bilaterally, respirations unlabored  Chest wall:    No tenderness or deformity  Heart:    Regular rate and rhythm, S1 and S2 normal, no murmur, rub   or gallop  Abdomen:     Soft, non-tender, bowel sounds active all four quadrants,    no masses, no organomegaly  Genitalia:    Normal male without lesion, discharge or  tenderness  Rectal:    Deferred at pt's request  Extremities:   Extremities normal, atraumatic, no cyanosis or edema  Pulses:   2+ and symmetric all extremities  Skin:   Skin color, texture, turgor normal, no rashes or lesions  Lymph nodes:   Cervical, supraclavicular, and axillary nodes normal  Neurologic:   CNII-XII intact. Normal strength, sensation and reflexes      throughout          Assessment & Plan:

## 2017-05-13 ENCOUNTER — Encounter: Payer: Self-pay | Admitting: General Practice

## 2017-07-10 ENCOUNTER — Other Ambulatory Visit: Payer: Self-pay | Admitting: Family Medicine

## 2017-11-12 ENCOUNTER — Ambulatory Visit: Payer: 59 | Admitting: Family Medicine

## 2017-11-13 ENCOUNTER — Other Ambulatory Visit: Payer: Self-pay

## 2017-11-13 ENCOUNTER — Encounter: Payer: Self-pay | Admitting: Family Medicine

## 2017-11-13 ENCOUNTER — Ambulatory Visit: Payer: 59 | Admitting: Family Medicine

## 2017-11-13 VITALS — BP 124/81 | HR 76 | Temp 98.1°F | Resp 16 | Ht 73.0 in | Wt 246.1 lb

## 2017-11-13 DIAGNOSIS — E010 Iodine-deficiency related diffuse (endemic) goiter: Secondary | ICD-10-CM

## 2017-11-13 DIAGNOSIS — E1169 Type 2 diabetes mellitus with other specified complication: Secondary | ICD-10-CM

## 2017-11-13 DIAGNOSIS — I10 Essential (primary) hypertension: Secondary | ICD-10-CM | POA: Diagnosis not present

## 2017-11-13 DIAGNOSIS — E119 Type 2 diabetes mellitus without complications: Secondary | ICD-10-CM | POA: Diagnosis not present

## 2017-11-13 DIAGNOSIS — E785 Hyperlipidemia, unspecified: Secondary | ICD-10-CM

## 2017-11-13 LAB — LIPID PANEL
CHOL/HDL RATIO: 3
Cholesterol: 160 mg/dL (ref 0–200)
HDL: 51.4 mg/dL (ref >39.00)
LDL Cholesterol: 99 mg/dL (ref 0–99)
NONHDL: 108.26
Triglycerides: 44 mg/dL (ref 0.0–149.0)
VLDL: 8.8 mg/dL (ref 0.0–40.0)

## 2017-11-13 LAB — HEPATIC FUNCTION PANEL
ALT: 27 U/L (ref 0–53)
AST: 22 U/L (ref 0–37)
Albumin: 4.4 g/dL (ref 3.5–5.2)
Alkaline Phosphatase: 51 U/L (ref 39–117)
BILIRUBIN DIRECT: 0.1 mg/dL (ref 0.0–0.3)
BILIRUBIN TOTAL: 0.6 mg/dL (ref 0.2–1.2)
Total Protein: 7.3 g/dL (ref 6.0–8.3)

## 2017-11-13 LAB — CBC WITH DIFFERENTIAL/PLATELET
BASOS ABS: 0 10*3/uL (ref 0.0–0.1)
Basophils Relative: 0.8 % (ref 0.0–3.0)
EOS PCT: 3.8 % (ref 0.0–5.0)
Eosinophils Absolute: 0.1 10*3/uL (ref 0.0–0.7)
HCT: 43.4 % (ref 39.0–52.0)
Hemoglobin: 14.2 g/dL (ref 13.0–17.0)
LYMPHS ABS: 1.6 10*3/uL (ref 0.7–4.0)
Lymphocytes Relative: 46 % (ref 12.0–46.0)
MCHC: 32.8 g/dL (ref 30.0–36.0)
MCV: 86.1 fl (ref 78.0–100.0)
MONO ABS: 0.3 10*3/uL (ref 0.1–1.0)
MONOS PCT: 9.6 % (ref 3.0–12.0)
NEUTROS PCT: 39.8 % — AB (ref 43.0–77.0)
Neutro Abs: 1.4 10*3/uL (ref 1.4–7.7)
PLATELETS: 219 10*3/uL (ref 150.0–400.0)
RBC: 5.04 Mil/uL (ref 4.22–5.81)
RDW: 12.8 % (ref 11.5–15.5)
WBC: 3.5 10*3/uL — ABNORMAL LOW (ref 4.0–10.5)

## 2017-11-13 LAB — BASIC METABOLIC PANEL
BUN: 16 mg/dL (ref 6–23)
CHLORIDE: 104 meq/L (ref 96–112)
CO2: 30 mEq/L (ref 19–32)
Calcium: 9.6 mg/dL (ref 8.4–10.5)
Creatinine, Ser: 0.97 mg/dL (ref 0.40–1.50)
GFR: 106.3 mL/min (ref >60.00)
Glucose, Bld: 102 mg/dL — ABNORMAL HIGH (ref 70–99)
POTASSIUM: 4.5 meq/L (ref 3.5–5.1)
SODIUM: 141 meq/L (ref 135–145)

## 2017-11-13 LAB — TSH: TSH: 1.41 u[IU]/mL (ref 0.35–4.50)

## 2017-11-13 LAB — HEMOGLOBIN A1C: Hgb A1c MFr Bld: 5.8 % (ref 4.6–6.5)

## 2017-11-13 NOTE — Assessment & Plan Note (Signed)
Chronic problem.  UTD on microalbumin, eye exam.  Foot exam done today.  Check labs.  Anticipatory guidance provided.

## 2017-11-13 NOTE — Assessment & Plan Note (Signed)
Chronic problem.  Adequate control.  Asymptomatic.  Check labs.  No anticipated med changes.  Will follow. 

## 2017-11-13 NOTE — Assessment & Plan Note (Signed)
Chronic problem.  Tolerating statin w/o difficulty.  Check labs.  Adjust meds prn  

## 2017-11-13 NOTE — Patient Instructions (Addendum)
Schedule your complete physical in 6 months We'll notify you of your lab results and make any changes if needed Keep up the good work on healthy diet and regular exercise- you look great! We'll call you with your thyroid ultrasound Call with any questions or concerns Happy Thanksgiving!

## 2017-11-13 NOTE — Assessment & Plan Note (Signed)
Chronic problem.  Pt has not gone for his [place] as ordered.  Check labs and pt agreeable to [place].  Will follow.

## 2017-11-13 NOTE — Progress Notes (Signed)
   Subjective:    Patient ID: Christian Henry, male    DOB: Sep 04, 1971, 46 y.o.   MRN: [id number]  HPI HTN- chronic problem, on Amlodipine 10mg  daily, Coreg 6.25mg  BID w/ good control.  Denies CP, SOB, HAs, visual changes, edema.  Hyperlipidemia- chronic problem, on Lipitor 20mg  daily.  Denies abd pain, N/V.  DM- chronic problem, diet  Controlled.  UTD on eye exam, due for foot exam.  UTD on microalbumin.  Denies numbness/tingling of hands/feet.  Exercising daily.   Review of Systems For ROS see HPI     Objective:   Physical Exam  Constitutional: He is oriented to person, place, and time. He appears well-developed and well-nourished. No distress.  HENT:  Head: Normocephalic and atraumatic.  Eyes: Conjunctivae and EOM are normal. Pupils are equal, round, and reactive to light.  Neck: Normal range of motion. Neck supple. Thyromegaly present.  Cardiovascular: Normal rate, regular rhythm, normal heart sounds and intact distal pulses.  No murmur heard. Pulmonary/Chest: Effort normal and breath sounds normal. No respiratory distress.  Abdominal: Soft. Bowel sounds are normal. He exhibits no distension.  Musculoskeletal: He exhibits no edema.  Lymphadenopathy:    He has no cervical adenopathy.  Neurological: He is alert and oriented to person, place, and time. No cranial nerve deficit.  Skin: Skin is warm and dry.  Psychiatric: He has a normal mood and affect. His behavior is normal.  Vitals reviewed.         Assessment & Plan:

## 2017-11-14 ENCOUNTER — Encounter: Payer: Self-pay | Admitting: General Practice

## 2017-11-19 ENCOUNTER — Ambulatory Visit (HOSPITAL_BASED_OUTPATIENT_CLINIC_OR_DEPARTMENT_OTHER)
Admission: RE | Admit: 2017-11-19 | Discharge: 2017-11-19 | Disposition: A | Discharge status: Home / Self Care | Payer: 59 | Source: Ambulatory Visit | Attending: Family Medicine | Admitting: Family Medicine

## 2017-11-19 ENCOUNTER — Telehealth: Payer: Self-pay | Admitting: Family Medicine

## 2017-11-19 DIAGNOSIS — E042 Nontoxic multinodular goiter: Secondary | ICD-10-CM | POA: Diagnosis not present

## 2017-11-19 DIAGNOSIS — E010 Iodine-deficiency related diffuse (endemic) goiter: Secondary | ICD-10-CM | POA: Diagnosis present

## 2017-11-19 NOTE — Progress Notes (Signed)
Called pt and lmovm to return call.    Ok for PEC to Discuss results / PCP recommendations / Schedule patient.  

## 2017-11-19 NOTE — Telephone Encounter (Signed)
Copied from [place]. Topic: Quick Communication - See Telephone Encounter >> Nov 19, 2017  9:30 AM Robina Ade, Helene Kelp D wrote: CRM for notification. See Telephone encounter for: 11/19/17. Patient called and wanted to let Dr. Birdie Riddle know that he went today and got his Ultrasound of his thyroid.

## 2017-12-01 ENCOUNTER — Telehealth: Payer: Self-pay

## 2017-12-01 NOTE — Telephone Encounter (Signed)
Copied from [place] [id number]. Topic: Quick Communication - See Telephone Encounter >> Nov 28, 2017  2:55 PM Hewitt Shorts wrote: CRM for notification. See Telephone encounter for: pt states that he takes amlodipine and that is one of the medications that has been recalled and he has confirmed from his pharmacy and he would like to know what to do next   Best number [phone]  11/28/17.

## 2017-12-01 NOTE — Telephone Encounter (Signed)
Please advise 

## 2017-12-01 NOTE — Telephone Encounter (Signed)
Pt called in to follow up on previous message sent. Advised pt that provider did respond. Read providers response to pt, pt expressed understanding. Pt has no questions, I did advise pt that if he think of any questions to give [place] a call back.

## 2017-12-01 NOTE — Telephone Encounter (Signed)
According to the information I found, the only Amlodipine that was recalled is the Amlodipine in combination with Valsartan.  Since pt is allergic to ARBs, this would not effect him.  He can continue to take the Amlodipine daily as his medication should not be impacted.

## 2018-01-06 ENCOUNTER — Other Ambulatory Visit: Payer: Self-pay | Admitting: Family Medicine

## 2018-01-13 IMAGING — CT CT ABD-PELV W/ CM
3 of 5 series · 17 of 46 positions shown, 19 images · IV contrast (ISOVUE)
Comparison: None.

CLINICAL DATA: Mid to lower abdominal/ pelvic pain for 4 days.

EXAM:
CT ABDOMEN AND PELVIS WITH CONTRAST
TECHNIQUE: Multidetector CT imaging of the abdomen and pelvis was performed
using the standard protocol following bolus administration of
intravenous contrast.
CONTRAST:  100mL [id number] IOPAMIDOL ([id number]) INJECTION 61%

[Series 2: rtn a/p with · axial · 0.74mm/px · z∈[-476,-46]mm · 12 of 102 slices shown, 14 images]
[im 8/102  soft-tissue]
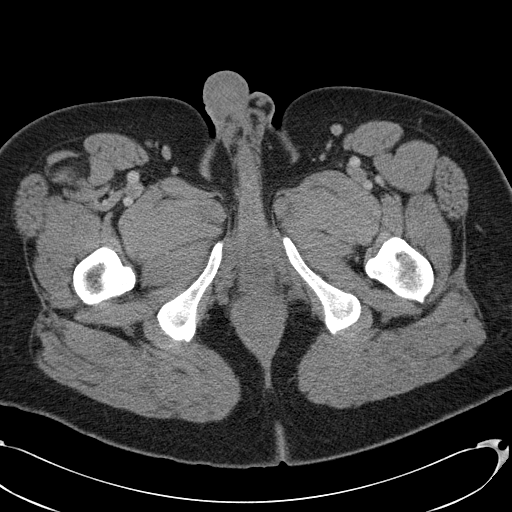
[im 8/102  bone]
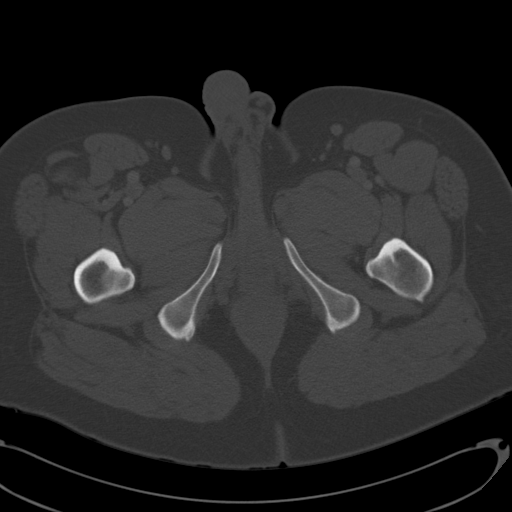
[im 16/102  soft-tissue]
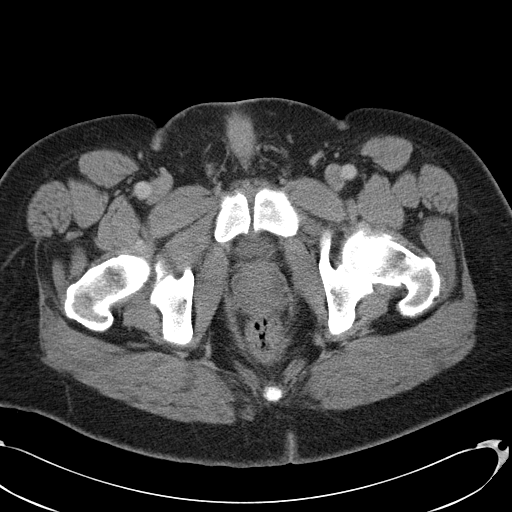
[im 24/102  soft-tissue]
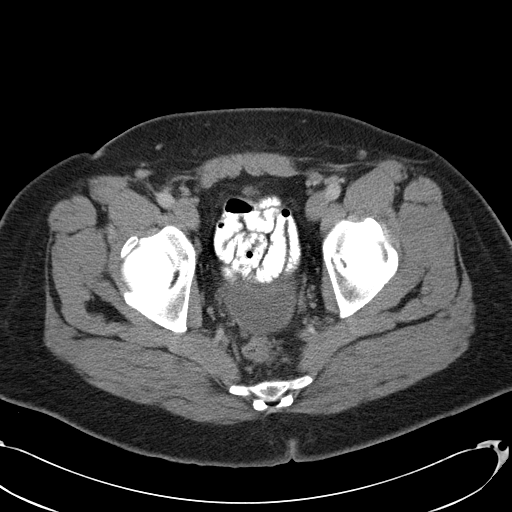
[im 32/102  soft-tissue]
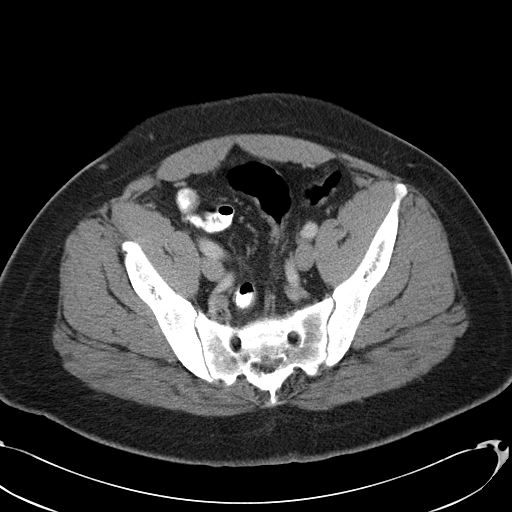
[im 39/102  soft-tissue]
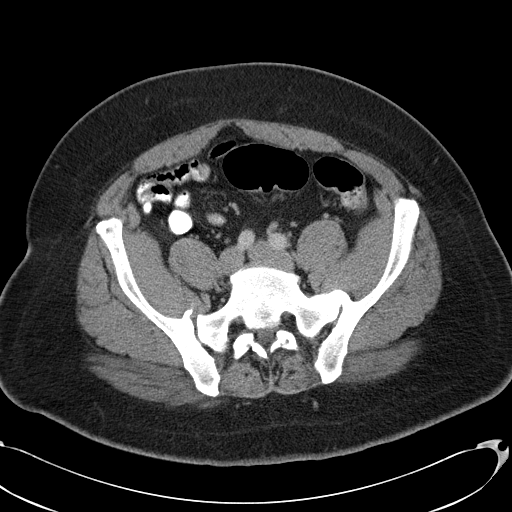
[im 47/102  soft-tissue]
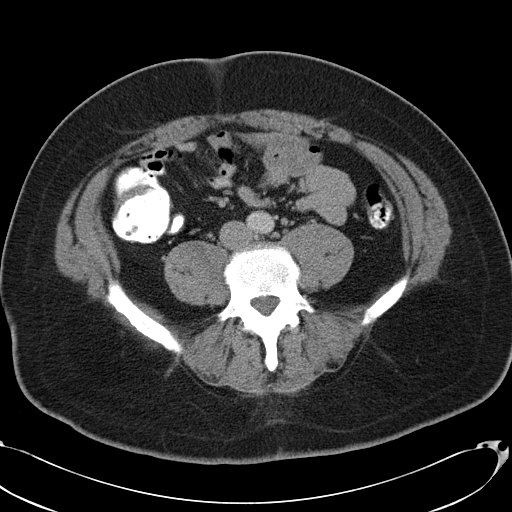
[im 55/102  soft-tissue]
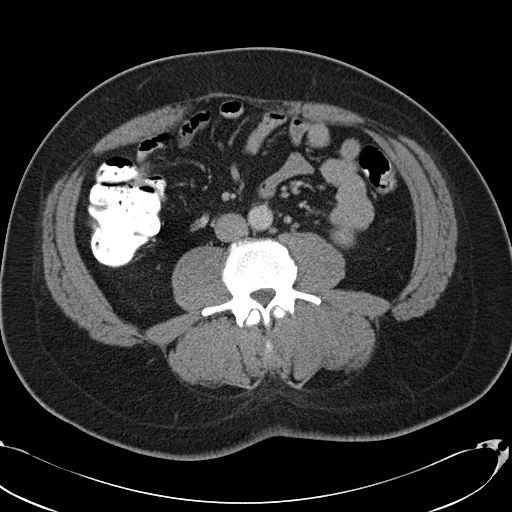
[im 63/102  soft-tissue]
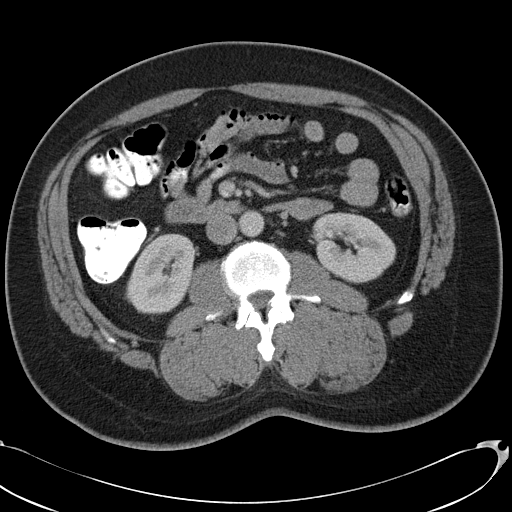
[im 70/102  soft-tissue]
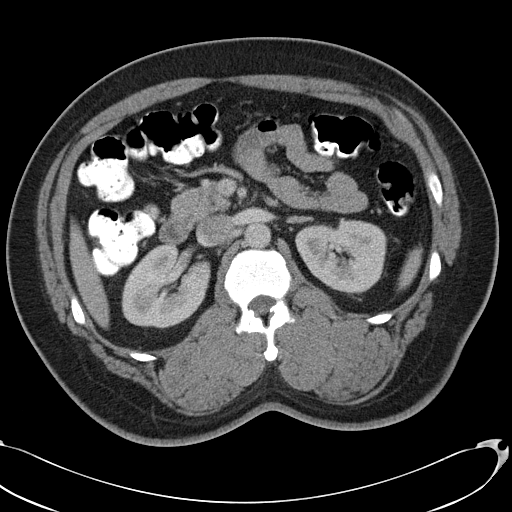
[im 70/102  bone]
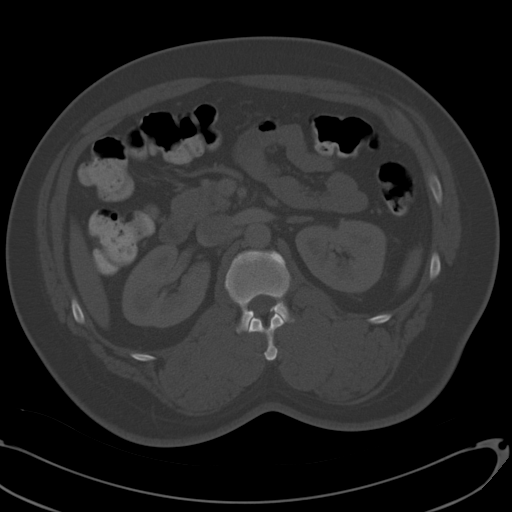
[im 78/102  soft-tissue]
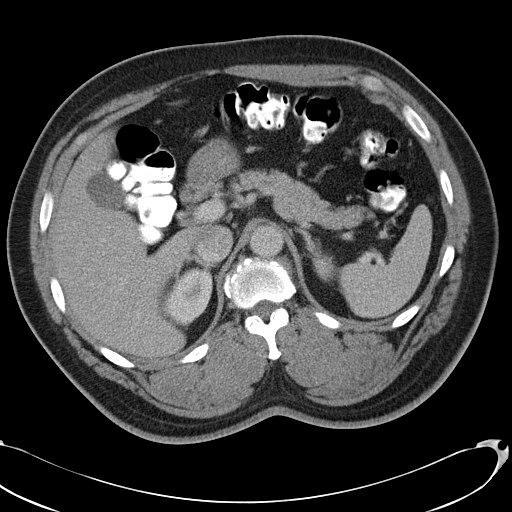
[im 86/102  soft-tissue]
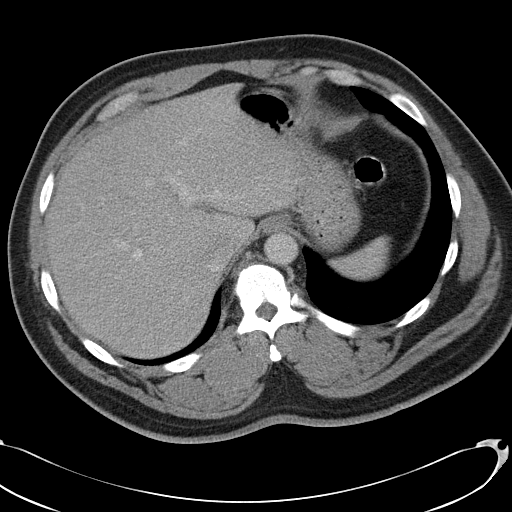
[im 94/102  soft-tissue]
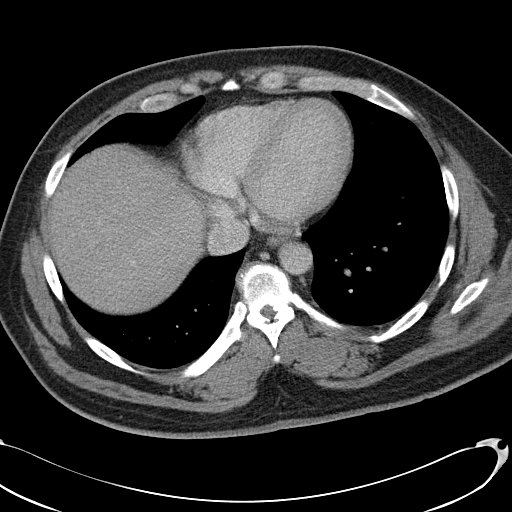

[Series 5: lung windows · axial · 0.74mm/px · z∈[-164,-136]mm · 2 of 87 slices shown]
[im 8/87  bone]
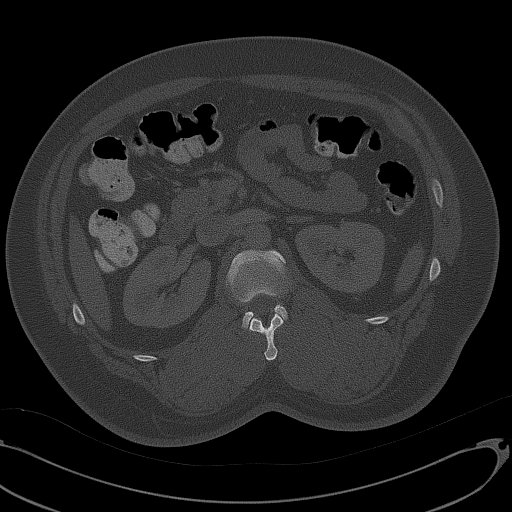
[im 22/87  bone]
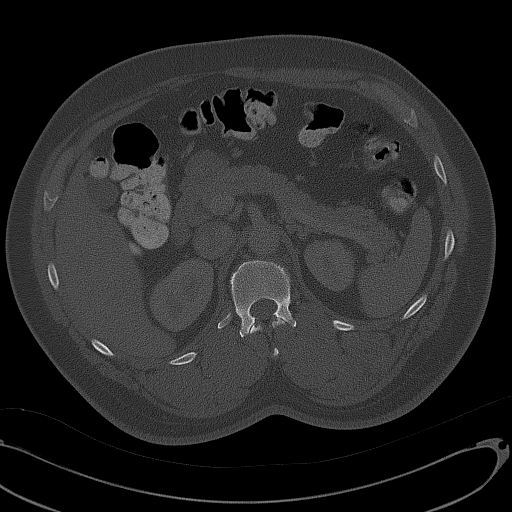

[Series 602: <mpr thick range> · coronal · 0.99mm/px · 3 of 153 slices shown]
[im 51/153  soft-tissue]
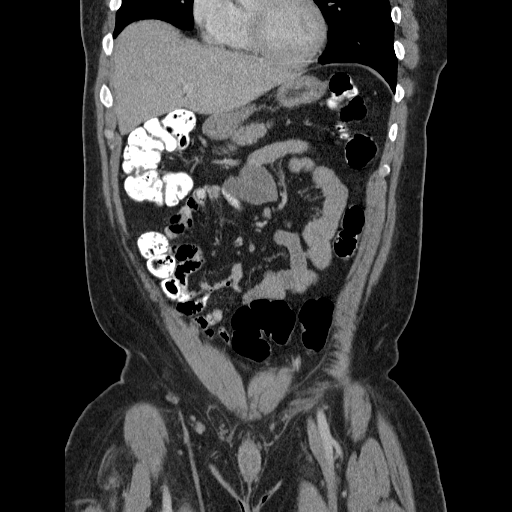
[im 68/153  soft-tissue]
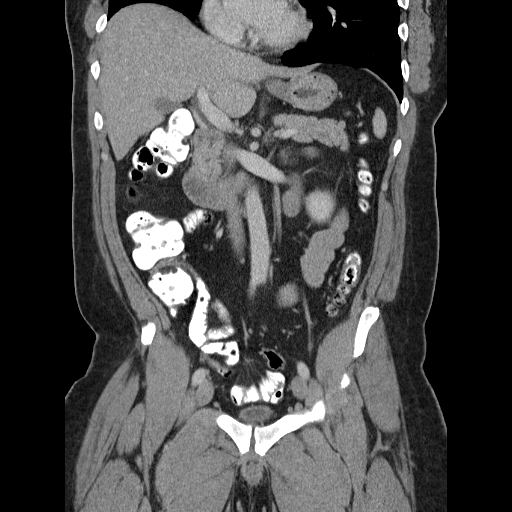
[im 85/153  soft-tissue]
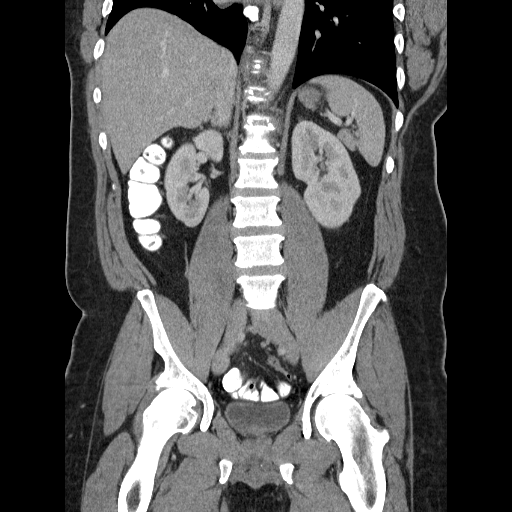

[17 of 46 positions shown; findings below may reference images not displayed]

FINDINGS: Lower chest:  No acute findings.

Hepatobiliary: No masses or other significant abnormality.

Pancreas: No mass, inflammatory changes, or other significant
abnormality.

Spleen: Within normal limits in size and appearance.

Adrenals/Urinary Tract: No masses identified. No evidence of
hydronephrosis.

Stomach/Bowel: Bowel is normal in caliber and configuration. No
bowel wall thickening or evidence of bowel wall inflammation.
Stomach appears normal. Appendix is normal.

Vascular/Lymphatic: No pathologically enlarged lymph nodes. No
evidence of abdominal aortic aneurysm.

Reproductive: No mass or other significant abnormality.

Other: No free fluid or abscess collection. No free intraperitoneal
air.

Musculoskeletal: Mild degenerative change within the thoracolumbar
spine mild to moderate in degree, with suspected associated nerve
root impingement or displacement at the L3-4 through L5-S1 levels.
No acute or suspicious osseous lesion. Surgical changes suggestive
of a partial laminectomy at the L5-S1 level.

Small periumbilical abdominal wall hernia which contains fat only.
Superficial soft tissues otherwise unremarkable.
IMPRESSION: 1. No acute findings in the abdomen or pelvis. No free fluid or
inflammatory change. No bowel obstruction. No renal or ureteral
calculi. Appendix is normal.
2. Degenerative changes within the spine, as detailed above. No
acute-appearing osseous abnormality.
3. Small periumbilical abdominal hernia which contains fat only. No
bowel involvement or inflammatory change.

## 2018-01-20 IMAGING — US US THYROID
1 series · 13 of 25 positions shown · non-contrast
Comparison: None.

CLINICAL DATA: 46-year-old male with a history of thyroid nodules.

EXAM:
THYROID ULTRASOUND
TECHNIQUE: Ultrasound examination of the thyroid gland and adjacent soft
tissues was performed.

[Series 1: us thyroid · 0.07mm/px · 13 of 88 slices shown]
[im 1/88]
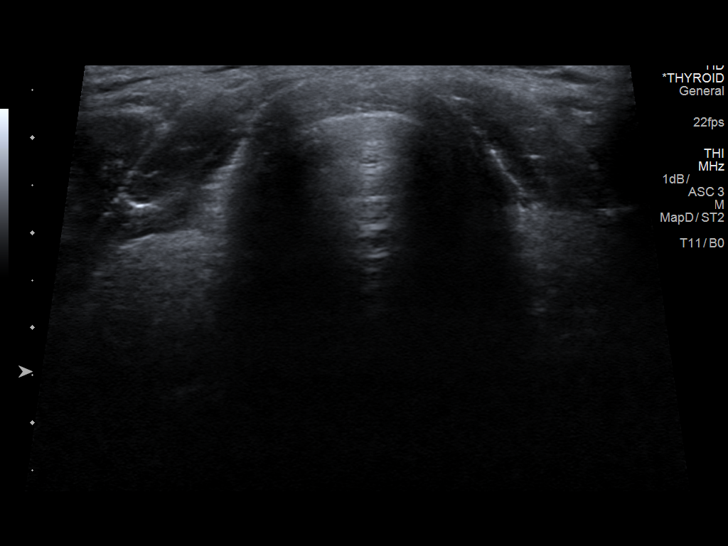
[im 8/88]
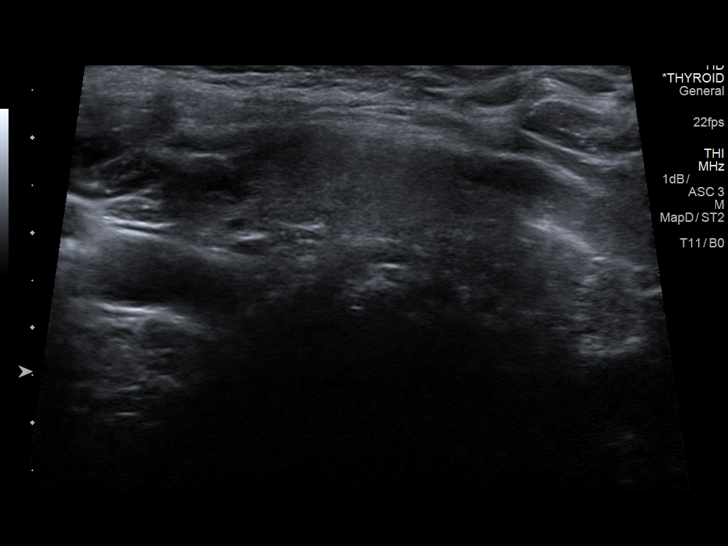
[im 15/88]
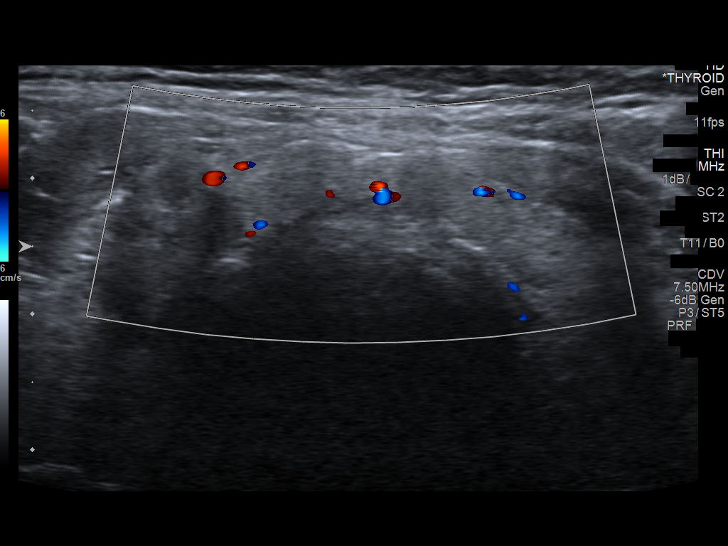
[im 22/88]
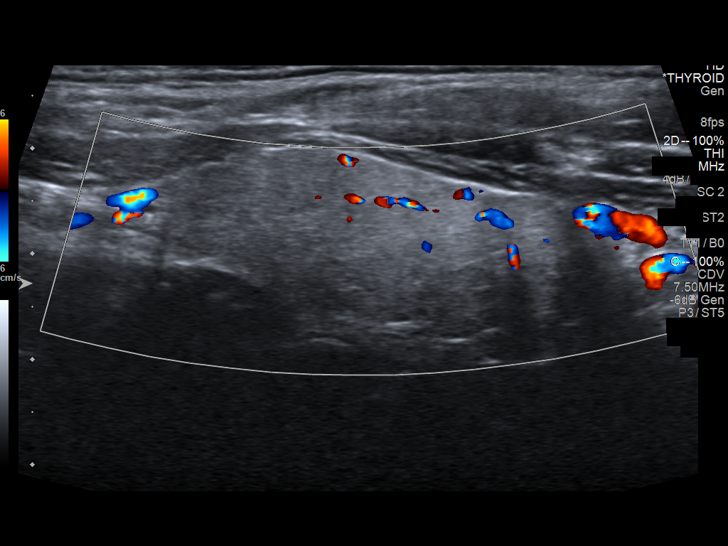
[im 30/88]
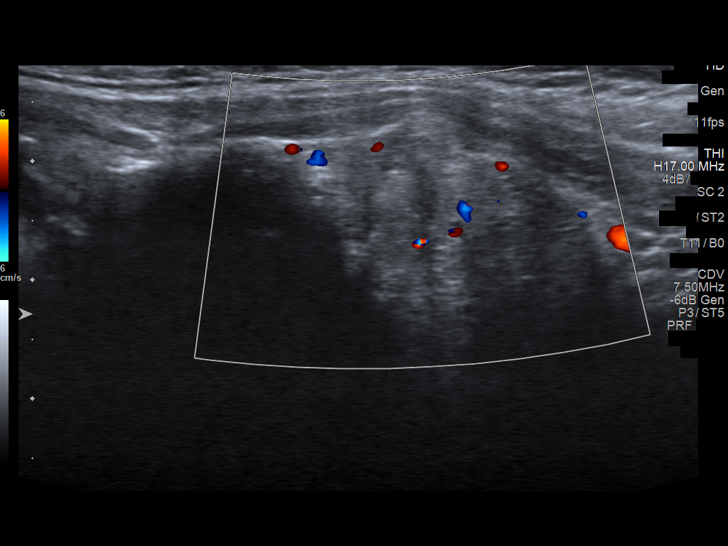
[im 37/88]
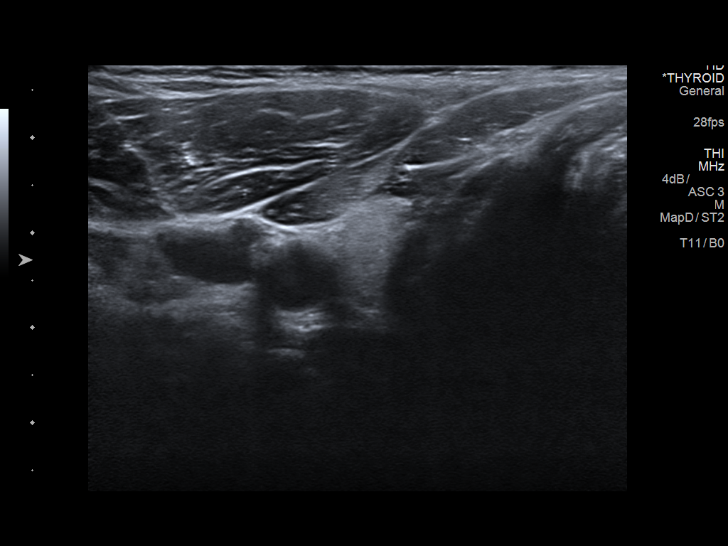
[im 44/88]
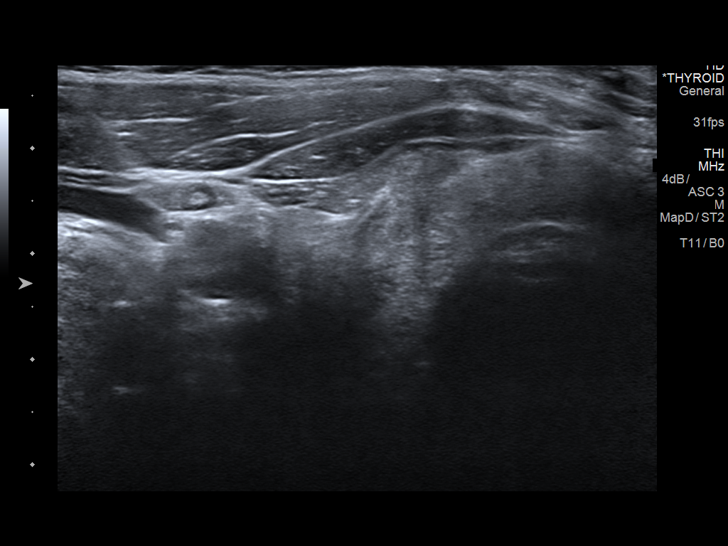
[im 51/88]
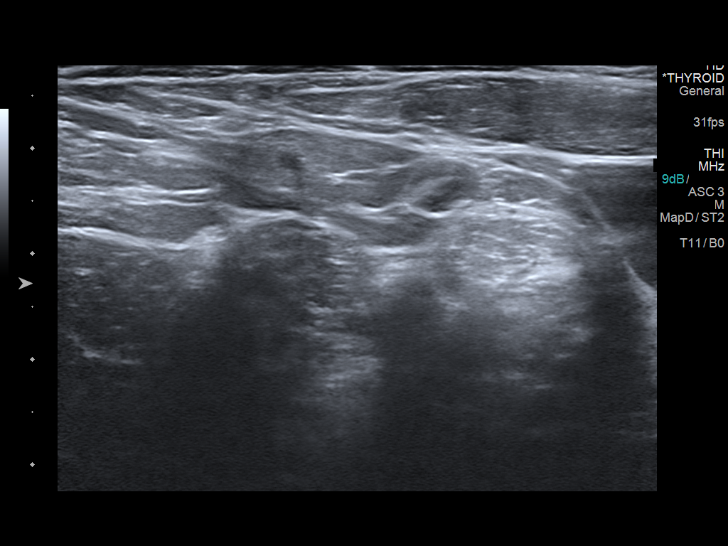
[im 59/88]
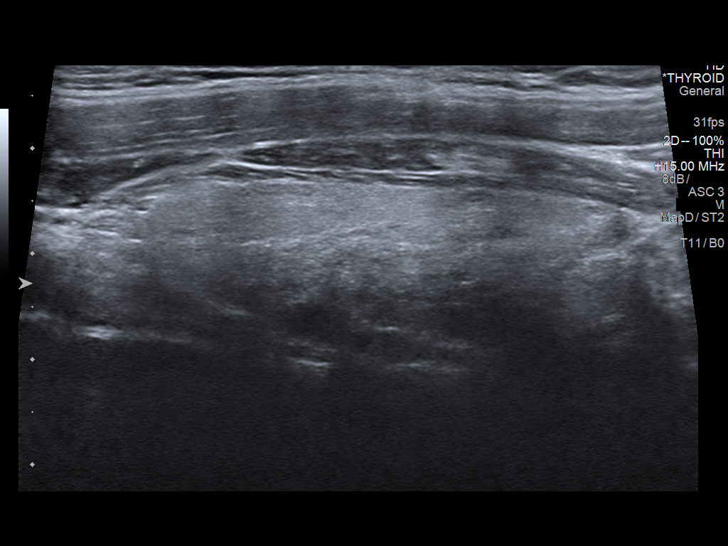
[im 66/88]
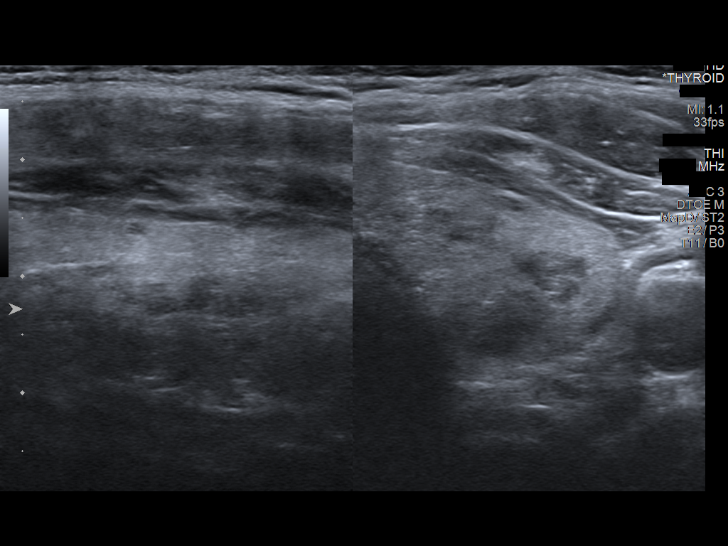
[im 73/88]
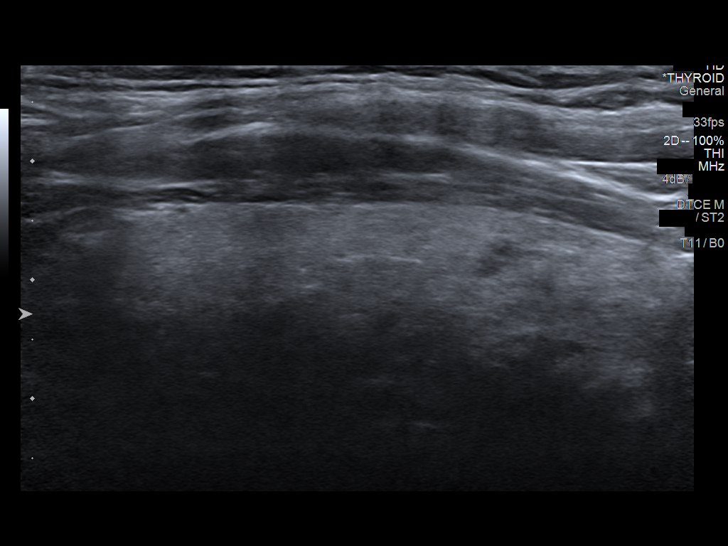
[im 80/88]
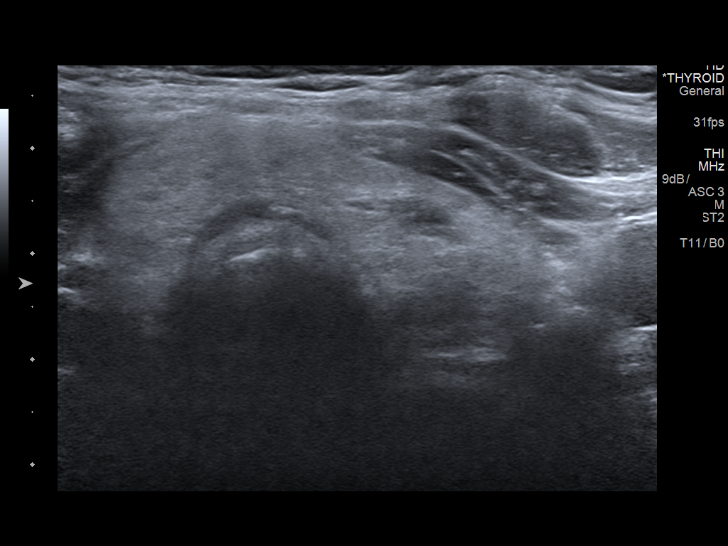
[im 88/88]
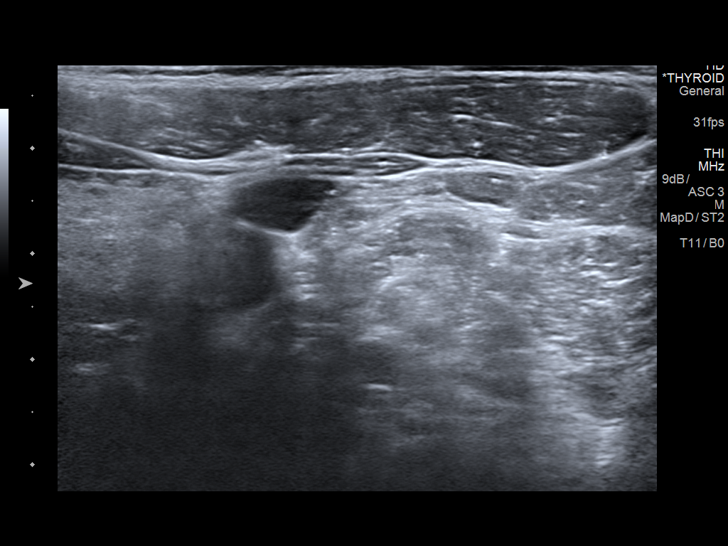

[13 of 25 positions shown; findings below may reference images not displayed]

FINDINGS: Parenchymal Echotexture: Mildly heterogenous

Isthmus: 0.7 cm

Right lobe: 5.0 cm x 2.1 cm x 2.3 cm

Left lobe: 5.1 cm x 1.8 cm x 2.0 cm

_________________________________________________________

Estimated total number of nodules >/= 1 cm: 2

Number of spongiform nodules >/=  2 cm not described below (TR1): 0

Number of mixed cystic and solid nodules >/= 1.5 cm not described
below (TR2): 0

_________________________________________________________

Nodule # 1:

Location: Right; Mid

Maximum size: 1.9 cm; Other 2 dimensions: 1.2 cm x 1.3 cm

Composition: solid/almost completely solid (2)

Echogenicity: isoechoic (1)

Shape: not taller-than-wide (0)

Margins: ill-defined (0)

Echogenic foci: none (0)

ACR TI-RADS total points: 3.

ACR TI-RADS risk category: TR3 (3 points).

ACR TI-RADS recommendations:

Nodule meets criteria for surveillance

_________________________________________________________

Nodule # 2:

Location: Left; Inferior

Maximum size: 0.7 cm; Other 2 dimensions: 0.4 cm x 0.6 cm

Composition: cannot determine (2)

Echogenicity: hypoechoic (2)

Shape: not taller-than-wide (0)

Margins: ill-defined (0)

Echogenic foci: none (0)

ACR TI-RADS total points: 4.

ACR TI-RADS risk category: TR4 (4-6 points).

ACR TI-RADS recommendations:

Nodule does not meet criteria for surveillance or biopsy

_________________________________________________________

Nodule # 3:

Location: Left; Inferior

Maximum size: 1.1 cm; Other 2 dimensions: 0.6 cm x 0.9 cm

Composition: cannot determine (2)

Echogenicity: hypoechoic (2)

Shape: not taller-than-wide (0)

Margins: ill-defined (0)

Echogenic foci: none (0)

ACR TI-RADS total points: 4.

ACR TI-RADS risk category: TR4 (4-6 points).

ACR TI-RADS recommendations:

Nodule meets criteria for surveillance

_________________________________________________________

Lymph nodes present bilaterally.
IMPRESSION: Right lower and left lower thyroid nodules (labeled 1 and 3 above)
meet criteria for surveillance, as designated by the newly
established ACR TI-RADS criteria. Surveillance ultrasound study
recommended to be performed annually up to 5 years.

The third nodule does not meet criteria for surveillance or biopsy.

Nonspecific bilateral lymph nodes, potentially reactive.

Recommendations follow those established by the new ACR TI-RADS
criteria ([HOSPITAL] 6875;[DATE]).

## 2018-02-14 LAB — HM DIABETES EYE EXAM

## 2018-05-14 ENCOUNTER — Encounter: Payer: PRIVATE HEALTH INSURANCE | Admitting: Family Medicine

## 2018-06-29 ENCOUNTER — Encounter: Payer: PRIVATE HEALTH INSURANCE | Admitting: Family Medicine

## 2018-07-05 ENCOUNTER — Other Ambulatory Visit: Payer: Self-pay | Admitting: Family Medicine

## 2018-10-05 ENCOUNTER — Telehealth: Payer: Self-pay | Admitting: Family Medicine

## 2018-10-05 DIAGNOSIS — Z0279 Encounter for issue of other medical certificate: Secondary | ICD-10-CM

## 2018-10-05 NOTE — Telephone Encounter (Signed)
Pt dropped off DMV forms, placed in bin with a charge sheet. Pt would like a call when they are ready.

## 2018-10-05 NOTE — Telephone Encounter (Signed)
Paperwork given to PCP for completion.  

## 2018-10-06 NOTE — Telephone Encounter (Signed)
PCP placed paperwork back in bin for front desk until pt can come in to be seen.

## 2018-10-06 NOTE — Telephone Encounter (Signed)
Pt is well overdue for appt.  Unable to complete forms until he is seen in office

## 2018-10-06 NOTE — Telephone Encounter (Signed)
Pt has an appt 10/11.

## 2018-10-09 ENCOUNTER — Ambulatory Visit: Payer: PRIVATE HEALTH INSURANCE | Admitting: Family Medicine

## 2018-10-09 ENCOUNTER — Encounter: Payer: Self-pay | Admitting: Family Medicine

## 2018-10-09 ENCOUNTER — Other Ambulatory Visit: Payer: Self-pay

## 2018-10-09 VITALS — BP 138/84 | HR 63 | Temp 98.3°F | Resp 16 | Ht 73.0 in | Wt 257.0 lb

## 2018-10-09 DIAGNOSIS — E785 Hyperlipidemia, unspecified: Secondary | ICD-10-CM

## 2018-10-09 DIAGNOSIS — E1169 Type 2 diabetes mellitus with other specified complication: Secondary | ICD-10-CM | POA: Diagnosis not present

## 2018-10-09 DIAGNOSIS — I10 Essential (primary) hypertension: Secondary | ICD-10-CM | POA: Diagnosis not present

## 2018-10-09 DIAGNOSIS — R0683 Snoring: Secondary | ICD-10-CM

## 2018-10-09 DIAGNOSIS — E119 Type 2 diabetes mellitus without complications: Secondary | ICD-10-CM | POA: Diagnosis not present

## 2018-10-09 LAB — LIPID PANEL
Cholesterol: 131 mg/dL (ref 0–200)
HDL: 44 mg/dL (ref >39.00)
LDL Cholesterol: 77 mg/dL (ref 0–99)
NonHDL: 87.32
Total CHOL/HDL Ratio: 3
Triglycerides: 51 mg/dL (ref 0.0–149.0)
VLDL: 10.2 mg/dL (ref 0.0–40.0)

## 2018-10-09 LAB — CBC WITH DIFFERENTIAL/PLATELET
BASOS PCT: 0.5 % (ref 0.0–3.0)
Basophils Absolute: 0 10*3/uL (ref 0.0–0.1)
EOS ABS: 0.1 10*3/uL (ref 0.0–0.7)
Eosinophils Relative: 3.2 % (ref 0.0–5.0)
HCT: 41.5 % (ref 39.0–52.0)
Hemoglobin: 13.9 g/dL (ref 13.0–17.0)
LYMPHS ABS: 1.6 10*3/uL (ref 0.7–4.0)
Lymphocytes Relative: 38.3 % (ref 12.0–46.0)
MCHC: 33.5 g/dL (ref 30.0–36.0)
MCV: 83.7 fl (ref 78.0–100.0)
MONO ABS: 0.4 10*3/uL (ref 0.1–1.0)
Monocytes Relative: 9.9 % (ref 3.0–12.0)
NEUTROS ABS: 2 10*3/uL (ref 1.4–7.7)
Neutrophils Relative %: 48.1 % (ref 43.0–77.0)
PLATELETS: 220 10*3/uL (ref 150.0–400.0)
RBC: 4.96 Mil/uL (ref 4.22–5.81)
RDW: 12.9 % (ref 11.5–15.5)
WBC: 4.2 10*3/uL (ref 4.0–10.5)

## 2018-10-09 LAB — BASIC METABOLIC PANEL
BUN: 16 mg/dL (ref 6–23)
CHLORIDE: 103 meq/L (ref 96–112)
CO2: 29 meq/L (ref 19–32)
CREATININE: 1.02 mg/dL (ref 0.40–1.50)
Calcium: 9.2 mg/dL (ref 8.4–10.5)
GFR: 99.93 mL/min (ref >60.00)
Glucose, Bld: 91 mg/dL (ref 70–99)
POTASSIUM: 4 meq/L (ref 3.5–5.1)
SODIUM: 139 meq/L (ref 135–145)

## 2018-10-09 LAB — HEPATIC FUNCTION PANEL
ALK PHOS: 47 U/L (ref 39–117)
ALT: 21 U/L (ref 0–53)
AST: 18 U/L (ref 0–37)
Albumin: 4.2 g/dL (ref 3.5–5.2)
BILIRUBIN DIRECT: 0.2 mg/dL (ref 0.0–0.3)
BILIRUBIN TOTAL: 0.7 mg/dL (ref 0.2–1.2)
Total Protein: 7.2 g/dL (ref 6.0–8.3)

## 2018-10-09 LAB — MICROALBUMIN / CREATININE URINE RATIO
CREATININE, U: 97.5 mg/dL
Microalb Creat Ratio: 0.7 mg/g (ref 0.0–30.0)

## 2018-10-09 LAB — HEMOGLOBIN A1C: HEMOGLOBIN A1C: 6 % (ref 4.6–6.5)

## 2018-10-09 LAB — TSH: TSH: 1.29 u[IU]/mL (ref 0.35–4.50)

## 2018-10-09 NOTE — Progress Notes (Signed)
   Subjective:    Patient ID: Christian Henry, male    DOB: 09-Jan-1971, 47 y.o.   MRN: [id number]  HPI HTN- chronic problem.  On Amlodipine 10mg  daily, Coreg 6.25 BID w/ adequate control.  No CP, SOB, HAs, visual changes, edema.  DM- chronic problem.  Has been diet controlled.  Pt has gained 11 lbs since last visit.  UTD on eye exam (March).  Due for microalbumin and foot exam.  No numbness/tingling of hands/feet.  Walking daily for 45 minutes  Hyperlipidemia- chronic problem, on Lipitor 20mg  daily.  No abd pain, N/V.  Snoring- wife is complaining about worsening snoring.  For last few weeks is having excessive daytime fatigue despite sleeping.  Asking for sleep study.   Review of Systems For ROS see HPI     Objective:   Physical Exam  Constitutional: He is oriented to person, place, and time. He appears well-developed and well-nourished. No distress.  HENT:  Head: Normocephalic and atraumatic.  Eyes: Pupils are equal, round, and reactive to light. Conjunctivae and EOM are normal.  Neck: Normal range of motion. Neck supple. No thyromegaly present.  Cardiovascular: Normal rate, regular rhythm, normal heart sounds and intact distal pulses.  No murmur heard. Pulmonary/Chest: Effort normal and breath sounds normal. No respiratory distress.  Abdominal: Soft. Bowel sounds are normal. He exhibits no distension.  Musculoskeletal: He exhibits no edema.  Lymphadenopathy:    He has no cervical adenopathy.  Neurological: He is alert and oriented to person, place, and time. No cranial nerve deficit.  Skin: Skin is warm and dry.  Psychiatric: He has a normal mood and affect. His behavior is normal.  Vitals reviewed.         Assessment & Plan:  Snoring- worsening.  Wife is complaining.  Pt is having daytime fatigue.  Refer to pulm for complete evaluation.  Pt expressed understanding and is in agreement w/ plan.

## 2018-10-09 NOTE — Assessment & Plan Note (Signed)
Chronic problem.  Adequate control.  Asymptomatic.  Check labs.  No anticipated med changes.  Will follow. 

## 2018-10-09 NOTE — Patient Instructions (Addendum)
Follow up as scheduled for your physical We'll notify you of your lab results and make any changes if needed Continue to work on healthy diet and regular exercise- you can do it! Call with any questions or concerns Happy Fall!!!

## 2018-10-09 NOTE — Assessment & Plan Note (Signed)
Chronic problem.  Tolerating statin w/o difficulty.  Encouraged healthy diet and regular exercise.  Check labs.  Adjust meds prn. 

## 2018-10-09 NOTE — Assessment & Plan Note (Signed)
Chronic problem.  Pt was doing a great job on diet and exercise but recently regained all of his weight.  UTD on eye exam.  Foot exam done today.  Microalbumin ordered.  Check labs.  Adjust tx prn.

## 2018-10-27 ENCOUNTER — Other Ambulatory Visit: Payer: Self-pay | Admitting: Family Medicine

## 2018-11-05 ENCOUNTER — Encounter: Payer: Self-pay | Admitting: Pulmonary Disease

## 2018-11-05 ENCOUNTER — Ambulatory Visit (INDEPENDENT_AMBULATORY_CARE_PROVIDER_SITE_OTHER): Payer: PRIVATE HEALTH INSURANCE | Admitting: Pulmonary Disease

## 2018-11-05 VITALS — BP 126/82 | HR 70 | Ht 73.0 in | Wt 259.0 lb

## 2018-11-05 DIAGNOSIS — G4726 Circadian rhythm sleep disorder, shift work type: Secondary | ICD-10-CM

## 2018-11-05 DIAGNOSIS — G4719 Other hypersomnia: Secondary | ICD-10-CM

## 2018-11-05 NOTE — Progress Notes (Signed)
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 amLODipine (NORVASC) 10 MG tablet TAKE 1 TABLET DAILY (PATIENT TO CALL OFFICE TO SCHEDULE COMPLETE PHYSICAL)  . atorvastatin (LIPITOR) 20 MG tablet TAKE 1 TABLET DAILY (CALL OFFICE TO SCHEDULE COMPLETE PHYSICAL)  . carvedilol (COREG) 6.25 MG tablet TAKE 1 TABLET TWICE A DAY WITH MEALS (CALL OFFICE TO SCHEDULE COMPLETE PHYSICAL)  . fluticasone (FLONASE) 50 MCG/ACT nasal spray Place 2 sprays into both nostrils daily.  . Omega-3 Fatty Acids (FISH OIL) 1200 MG CAPS Take by mouth daily.    . Potassium Gluconate 550 MG TABS Take by mouth daily.     No facility-administered encounter medications on file as of 11/05/2018.     Allergies as of 11/05/2018 - Review Complete 11/05/2018  Allergen Reaction Noted  . Lisinopril  08/07/2009    Past Medical History:  Diagnosis Date  . Diabetes mellitus without complication ([place])   . Hyperlipidemia   . Hypertension     Past Surgical History:  Procedure Laterality Date  . BACK SURGERY  12/2009    No family history on file.  Social History   Socioeconomic History  . Marital status: Married    Spouse name: Not on file  . Number of children: Not on file  . Years of education: Not on file  .  Highest education level: Not on file  Occupational History  . Not on file  Social Needs  . Financial resource strain: Not on file  . Food insecurity:    Worry: Not on file    Inability: Not on file  . Transportation needs:    Medical: Not on file    Non-medical: Not on file  Tobacco Use  . Smoking status: Never Smoker  . Smokeless tobacco: Never Used  Substance and Sexual Activity  . Alcohol use: Yes    Comment: on occasion  . Drug use: No  . Sexual activity: Not on file  Lifestyle  . Physical activity:    Days per week: Not on file    Minutes per session: Not on file  . Stress: Not on file  Relationships  . Social connections:    Talks on phone: Not on file    Gets together: Not on file    Attends religious service: Not on file    Active member of club or organization: Not on file    Attends meetings of clubs or organizations: Not on file    Relationship status: Not on file  . Intimate partner violence:    Fear of current or ex partner: Not on file    Emotionally abused: Not on file    Physically abused: Not on file    Forced sexual activity: Not  on file  Other Topics Concern  . Not on file  Social History Narrative   Married, 2 kids.    Review of Systems  Constitutional: Negative.   HENT: Negative.   Eyes: Negative.   Respiratory: Positive for apnea.   Cardiovascular: Negative.   Gastrointestinal: Negative.   Endocrine: Negative.   Psychiatric/Behavioral: Positive for sleep disturbance.    Vitals:   11/05/18 1447  BP: 126/82  Pulse: 70  SpO2: 100%     Physical Exam  Constitutional: He appears well-developed.  HENT:  Head: Normocephalic and atraumatic.  Eyes: Pupils are equal, round, and reactive to light. Right eye exhibits no discharge.  Neck: Normal range of motion. Neck supple. No tracheal deviation present. No thyromegaly present.  Cardiovascular: Normal rate and regular rhythm.  Pulmonary/Chest: Effort normal and breath sounds normal. No  respiratory distress. He has no wheezes.  Abdominal: Soft. Bowel sounds are normal. He exhibits no distension. There is no tenderness.  Musculoskeletal: Normal range of motion. He exhibits no edema.  Neurological: He is alert. No cranial nerve deficit.  Skin: He is not diaphoretic.   Assessment:  Moderate probability of significant sleep disordered breathing  Shiftwork sleep disorder  Plan/Recommendations:  We will schedule patient for home sleep study  Pathophysiology of sleep disordered breathing discussed  Treatment options discussed  Use of melatonin for sleep onset insomnia discussed  Use of a stimulant for shiftwork sleep disorder also discussed  I will see him back in the office in about 3 months Encouraged to call if any significant concerns   Sherrilyn Rist MD [facility] 11/05/2018, 3:22 PM  CC: Midge Minium, MD

## 2018-11-05 NOTE — Patient Instructions (Signed)
Moderate probability of significant obstructive sleep apnea  Shiftwork sleep disorder  We will set you up with a home sleep study Possible treatment with an auto titrating CPAP  May try use of melatonin 3 to 5 mg 1 to 2 hours prior to desired bedtime-short acting, may help your sleep  We will see you back in the office in 2 to 3 months Call with significant concerns

## 2018-12-24 ENCOUNTER — Other Ambulatory Visit: Payer: Self-pay | Admitting: General Practice

## 2018-12-24 MED ORDER — AMLODIPINE BESYLATE 10 MG PO TABS: ORAL_TABLET | ORAL | 1 refills | Status: DC

## 2018-12-24 MED ORDER — ATORVASTATIN CALCIUM 20 MG PO TABS: ORAL_TABLET | ORAL | 1 refills | Status: DC

## 2018-12-24 MED ORDER — CARVEDILOL 6.25 MG PO TABS: ORAL_TABLET | ORAL | 1 refills | Status: DC

## 2018-12-29 ENCOUNTER — Telehealth: Payer: Self-pay | Admitting: Pulmonary Disease

## 2018-12-29 DIAGNOSIS — G4726 Circadian rhythm sleep disorder, shift work type: Secondary | ICD-10-CM

## 2018-12-29 NOTE — Telephone Encounter (Signed)
We will have to wait for authorization from his insurance prior to scheduling the HST.

## 2018-12-29 NOTE — Telephone Encounter (Signed)
Patient is aware that this will be scheduled once approved.

## 2018-12-29 NOTE — Telephone Encounter (Signed)
Called and spoke with patient, he wanted to know when he would be set up for a home sleep study. Apologized to patient as this was not ordered per AO note below. Ordered placed. Will route this over to [facility] to see if we can get this scheduled ASAP.   Instructions   Moderate probability of significant obstructive sleep apnea  Shiftwork sleep disorder  We will set you up with a home sleep study Possible treatment with an auto titrating CPAP  May try use of melatonin 3 to 5 mg 1 to 2 hours prior to desired bedtime-short acting, may help your sleep  We will see you back in the office in 2 to 3 months Call with significant concerns

## 2019-01-14 ENCOUNTER — Ambulatory Visit (INDEPENDENT_AMBULATORY_CARE_PROVIDER_SITE_OTHER): Payer: PRIVATE HEALTH INSURANCE | Admitting: Family Medicine

## 2019-01-14 ENCOUNTER — Other Ambulatory Visit: Payer: Self-pay

## 2019-01-14 ENCOUNTER — Encounter: Payer: Self-pay | Admitting: Family Medicine

## 2019-01-14 ENCOUNTER — Encounter

## 2019-01-14 VITALS — BP 122/82 | HR 64 | Temp 98.1°F | Resp 16 | Ht 73.0 in | Wt 262.5 lb

## 2019-01-14 DIAGNOSIS — E1169 Type 2 diabetes mellitus with other specified complication: Secondary | ICD-10-CM

## 2019-01-14 DIAGNOSIS — Z1211 Encounter for screening for malignant neoplasm of colon: Secondary | ICD-10-CM

## 2019-01-14 DIAGNOSIS — Z0000 Encounter for general adult medical examination without abnormal findings: Secondary | ICD-10-CM | POA: Diagnosis not present

## 2019-01-14 DIAGNOSIS — E119 Type 2 diabetes mellitus without complications: Secondary | ICD-10-CM | POA: Diagnosis not present

## 2019-01-14 DIAGNOSIS — Z125 Encounter for screening for malignant neoplasm of prostate: Secondary | ICD-10-CM | POA: Diagnosis not present

## 2019-01-14 DIAGNOSIS — G4733 Obstructive sleep apnea (adult) (pediatric): Secondary | ICD-10-CM | POA: Diagnosis not present

## 2019-01-14 DIAGNOSIS — E785 Hyperlipidemia, unspecified: Secondary | ICD-10-CM

## 2019-01-14 LAB — BASIC METABOLIC PANEL
BUN: 14 mg/dL (ref 6–23)
CHLORIDE: 104 meq/L (ref 96–112)
CO2: 31 mEq/L (ref 19–32)
Calcium: 9.3 mg/dL (ref 8.4–10.5)
Creatinine, Ser: 1.02 mg/dL (ref 0.40–1.50)
GFR: 93.92 mL/min (ref >60.00)
GLUCOSE: 110 mg/dL — AB (ref 70–99)
POTASSIUM: 4 meq/L (ref 3.5–5.1)
SODIUM: 142 meq/L (ref 135–145)

## 2019-01-14 LAB — CBC WITH DIFFERENTIAL/PLATELET
BASOS PCT: 0.6 % (ref 0.0–3.0)
Basophils Absolute: 0 10*3/uL (ref 0.0–0.1)
EOS PCT: 5.6 % — AB (ref 0.0–5.0)
Eosinophils Absolute: 0.2 10*3/uL (ref 0.0–0.7)
HCT: 40.6 % (ref 39.0–52.0)
HEMOGLOBIN: 13.4 g/dL (ref 13.0–17.0)
LYMPHS ABS: 1.5 10*3/uL (ref 0.7–4.0)
Lymphocytes Relative: 37.3 % (ref 12.0–46.0)
MCHC: 33.1 g/dL (ref 30.0–36.0)
MCV: 83.8 fl (ref 78.0–100.0)
MONO ABS: 0.5 10*3/uL (ref 0.1–1.0)
Monocytes Relative: 11.8 % (ref 3.0–12.0)
NEUTROS ABS: 1.8 10*3/uL (ref 1.4–7.7)
NEUTROS PCT: 44.7 % (ref 43.0–77.0)
PLATELETS: 213 10*3/uL (ref 150.0–400.0)
RBC: 4.84 Mil/uL (ref 4.22–5.81)
RDW: 12.9 % (ref 11.5–15.5)
WBC: 3.9 10*3/uL — ABNORMAL LOW (ref 4.0–10.5)

## 2019-01-14 LAB — HEPATIC FUNCTION PANEL
ALT: 42 U/L (ref 0–53)
AST: 29 U/L (ref 0–37)
Albumin: 4.3 g/dL (ref 3.5–5.2)
Alkaline Phosphatase: 61 U/L (ref 39–117)
BILIRUBIN TOTAL: 0.7 mg/dL (ref 0.2–1.2)
Bilirubin, Direct: 0.1 mg/dL (ref 0.0–0.3)
Total Protein: 7.1 g/dL (ref 6.0–8.3)

## 2019-01-14 LAB — TSH: TSH: 1.21 u[IU]/mL (ref 0.35–4.50)

## 2019-01-14 LAB — LIPID PANEL
CHOLESTEROL: 127 mg/dL (ref 0–200)
HDL: 44.3 mg/dL (ref >39.00)
LDL CALC: 71 mg/dL (ref 0–99)
NonHDL: 82.65
Total CHOL/HDL Ratio: 3
Triglycerides: 59 mg/dL (ref 0.0–149.0)
VLDL: 11.8 mg/dL (ref 0.0–40.0)

## 2019-01-14 LAB — PSA: PSA: 0.45 ng/mL (ref 0.10–4.00)

## 2019-01-14 LAB — HEMOGLOBIN A1C: HEMOGLOBIN A1C: 6.2 % (ref 4.6–6.5)

## 2019-01-14 NOTE — Assessment & Plan Note (Signed)
Ongoing issue for pt.  UTD on foot exam, eye exam, microalbumin.  Encouraged healthy diet and regular exercise.  Check labs and adjust tx prn.

## 2019-01-14 NOTE — Progress Notes (Signed)
   Subjective:    Patient ID: Christian Henry, male    DOB: 17-Apr-1971, 48 y.o.   MRN: [id number]  HPI CPE- UTD on Tdap, no flu.  UTD on eye exam, foot exam, microalbumin   Review of Systems Patient reports no vision/hearing changes, anorexia, fever ,adenopathy, persistant/recurrent hoarseness, swallowing issues, chest pain, palpitations, edema, persistant/recurrent cough, hemoptysis, dyspnea (rest,exertional, paroxysmal nocturnal), gastrointestinal  bleeding (melena, rectal bleeding), abdominal pain, excessive heart burn, GU symptoms (dysuria, hematuria, voiding/incontinence issues) syncope, focal weakness, memory loss, numbness & tingling, skin/hair/nail changes, depression, anxiety, abnormal bruising/bleeding, musculoskeletal symptoms/signs.     Objective:   Physical Exam BP 122/82   Pulse 64   Temp 98.1 F (36.7 C) (Oral)   Resp 16   Ht 6\' 1"  (1.854 m)   Wt 262 lb 8 oz (119.1 kg)   SpO2 98%   BMI 34.63 kg/m   General Appearance:    Alert, cooperative, no distress, appears stated age  Head:    Normocephalic, without obvious abnormality, atraumatic  Eyes:    PERRL, conjunctiva/corneas clear, EOM's intact, fundi    benign, both eyes       Ears:    Normal TM's and external ear canals, both ears  Nose:   Nares normal, septum midline, mucosa normal, no drainage   or sinus tenderness  Throat:   Lips, mucosa, and tongue normal; teeth and gums normal  Neck:   Supple, symmetrical, trachea midline, no adenopathy;       thyroid:  No enlargement/tenderness/nodules  Back:     Symmetric, no curvature, ROM normal, no CVA tenderness  Lungs:     Clear to auscultation bilaterally, respirations unlabored  Chest wall:    No tenderness or deformity  Heart:    Regular rate and rhythm, S1 and S2 normal, no murmur, rub   or gallop  Abdomen:     Soft, non-tender, bowel sounds active all four quadrants,    no masses, no organomegaly  Genitalia:    Normal male without lesion, discharge or tenderness    Rectal:    Normal tone, normal prostate, no masses or tenderness  Extremities:   Extremities normal, atraumatic, no cyanosis or edema  Pulses:   2+ and symmetric all extremities  Skin:   Skin color, texture, turgor normal, no rashes or lesions  Lymph nodes:   Cervical, supraclavicular, and axillary nodes normal  Neurologic:   CNII-XII intact. Normal strength, sensation and reflexes      throughout          Assessment & Plan:

## 2019-01-14 NOTE — Assessment & Plan Note (Signed)
Chronic problem.  Tolerating statin w/o difficulty.  Check labs.  Adjust meds prn  

## 2019-01-14 NOTE — Patient Instructions (Signed)
Follow up in 3-4 months to recheck sugar and weight loss progress We'll notify you of your lab results and make any changes if needed Continue to work on healthy diet and regular exercise- you can do it! Call with any questions or concerns Happy [employer] Birthday!!!

## 2019-01-14 NOTE — Assessment & Plan Note (Signed)
Pt's PE WNL w/ exception of obesity.  Will refer for colonoscopy as pt is 70 and African American and he desires screening.  Check labs.  Anticipatory guidance provided.

## 2019-01-15 DIAGNOSIS — G4733 Obstructive sleep apnea (adult) (pediatric): Secondary | ICD-10-CM

## 2019-01-18 ENCOUNTER — Other Ambulatory Visit: Payer: Self-pay | Admitting: *Deleted

## 2019-01-18 DIAGNOSIS — G4726 Circadian rhythm sleep disorder, shift work type: Secondary | ICD-10-CM

## 2019-01-19 ENCOUNTER — Telehealth: Payer: Self-pay | Admitting: Pulmonary Disease

## 2019-01-19 ENCOUNTER — Encounter: Payer: Self-pay | Admitting: Gastroenterology

## 2019-01-19 DIAGNOSIS — G4733 Obstructive sleep apnea (adult) (pediatric): Secondary | ICD-10-CM

## 2019-01-19 NOTE — Telephone Encounter (Signed)
Dr. Ander Slade has reviewed the home sleep test this showed Severe Obstructive Sleep apnea with moderate oxygen desaturations.   Recommendations   Treatment options are CPAP with the settings auto 5 to 15.    Weight loss measures .   Advise against driving while sleepy & against medication with sedative side effects.    Make appointment for 3 months for compliance with download with Dr. Ander Slade.   Patients aware of this nothing further needed order placed.

## 2019-02-10 ENCOUNTER — Ambulatory Visit: Payer: PRIVATE HEALTH INSURANCE | Admitting: Gastroenterology

## 2019-02-11 ENCOUNTER — Telehealth: Payer: Self-pay | Admitting: Pulmonary Disease

## 2019-02-11 NOTE — Telephone Encounter (Signed)
Spoke to [place] who advises that she received a voicemail from the patient today & that Lovena Le will be reaching out to the patient today.  Called pt & let pt know.  Pt verbalized understanding & states nothing further needed at this time.

## 2019-03-09 LAB — HM DIABETES EYE EXAM

## 2019-03-25 ENCOUNTER — Encounter: Payer: Self-pay | Admitting: Family Medicine

## 2019-04-22 ENCOUNTER — Ambulatory Visit (INDEPENDENT_AMBULATORY_CARE_PROVIDER_SITE_OTHER): Payer: PRIVATE HEALTH INSURANCE | Admitting: Nurse Practitioner

## 2019-04-22 ENCOUNTER — Encounter: Payer: Self-pay | Admitting: Nurse Practitioner

## 2019-04-22 ENCOUNTER — Ambulatory Visit: Payer: PRIVATE HEALTH INSURANCE | Admitting: Pulmonary Disease

## 2019-04-22 ENCOUNTER — Other Ambulatory Visit: Payer: Self-pay

## 2019-04-22 DIAGNOSIS — G4733 Obstructive sleep apnea (adult) (pediatric): Secondary | ICD-10-CM | POA: Diagnosis not present

## 2019-04-22 NOTE — Progress Notes (Signed)
Virtual Visit via Telephone Note  I connected with Christian Henry on 04/22/19 at  9:30 AM EDT by telephone and verified that I am speaking with the correct person using two identifiers.   I discussed the limitations, risks, security and privacy concerns of performing an evaluation and management service by telephone and the availability of in person appointments. I also discussed with the patient that there may be a patient responsible charge related to this service. The patient expressed understanding and agreed to proceed.   History of Present Illness: 48 year old male with OSA who is followed by Dr. Ander Slade  Patient has a tele-visit today for follow-up on OSA.  He was last seen by Dr. Ander Slade on 11/05/2018.  He was ordered a sleep study which reveals severe sleep apnea.  Patient was ordered a CPAP.  States that he has been doing well with his new CPAP machine.  He wears it nightly and states that he benefits from it and feels much less drowsy during the day.  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  He denies any issues with mask.  He does not need any new supplies at this time.  He uses [employer] for his DME company. Denies f/c/s, n/v/d, hemoptysis, PND, leg swelling.    Observations/Objective:  HST 01/14/19 - AHI 37.5, SpO2 72%  Assessment and Plan:  OSA: Patient is doing well with new CPAP machine Patient continues to benefit from CPAP with good compliance and control documented Continue CPAP at current settings Continue current medications Goal of 4 hours or more usage per night Maintain healthy weight Do not drive if drowsy   Follow Up Instructions:  Follow up with Dr. Ander Slade in 4 months or sooner if needed   I discussed the assessment and treatment plan with the patient. The patient was provided an opportunity to ask questions and all were answered. The patient agreed with the plan and demonstrated an understanding of the instructions.   The patient was advised to call back or seek an in-person evaluation if the symptoms worsen or if the condition fails to improve as anticipated.  I provided 22 minutes of non-face-to-face time during this encounter.   Fenton Foy, NP

## 2019-04-22 NOTE — Assessment & Plan Note (Signed)
Patient is doing well with new CPAP machine Patient continues to benefit from CPAP with good compliance and control documented Continue CPAP at current settings Continue current medications Goal of 4 hours or more usage per night Maintain healthy weight Do not drive if drowsy   Follow up with Dr. Ander Slade in 4 months or sooner if needed

## 2019-04-22 NOTE — Patient Instructions (Signed)
Patient continues to benefit from CPAP with good compliance and control documented Continue CPAP at current settings Continue current medications Goal of 4 hours or more usage per night Maintain healthy weight Do not drive if drowsy Follow up with Dr. Ander Slade in 4 months or sooner if needed

## 2019-05-05 ENCOUNTER — Encounter: Payer: Self-pay | Admitting: Family Medicine

## 2019-05-05 ENCOUNTER — Other Ambulatory Visit: Payer: Self-pay

## 2019-05-05 ENCOUNTER — Ambulatory Visit (INDEPENDENT_AMBULATORY_CARE_PROVIDER_SITE_OTHER): Payer: PRIVATE HEALTH INSURANCE | Admitting: Family Medicine

## 2019-05-05 VITALS — Ht 73.0 in | Wt 248.0 lb

## 2019-05-05 DIAGNOSIS — E669 Obesity, unspecified: Secondary | ICD-10-CM | POA: Diagnosis not present

## 2019-05-05 DIAGNOSIS — I10 Essential (primary) hypertension: Secondary | ICD-10-CM | POA: Diagnosis not present

## 2019-05-05 DIAGNOSIS — E119 Type 2 diabetes mellitus without complications: Secondary | ICD-10-CM | POA: Diagnosis not present

## 2019-05-05 DIAGNOSIS — E1169 Type 2 diabetes mellitus with other specified complication: Secondary | ICD-10-CM

## 2019-05-05 DIAGNOSIS — E785 Hyperlipidemia, unspecified: Secondary | ICD-10-CM

## 2019-05-05 NOTE — Progress Notes (Signed)
Virtual Visit via Video   I connected with patient on 05/05/19 at  9:20 AM EDT by a video enabled telemedicine application and verified that I am speaking with the correct person using two identifiers.  Location patient: Home Location provider: Acupuncturist, Office Persons participating in the virtual visit: Patient, Provider, [place] (Jess B)  I discussed the limitations of evaluation and management by telemedicine and the availability of in person appointments. The patient expressed understanding and agreed to proceed.  Subjective:   HPI:   DM- chronic problem.  Pt has been able to control w/ diet and exercise.  UTD on microalbumin, foot exam.  Due for eye exam.  Denies numbness/tingling of hands/feet.  No CP, SOB, HAs, visual changes, abd pain, numbness/tingling of hands/feet.  HTN- chronic problem, on Amlodipine 10mg  daily, Coreg 6.25mg  BID.  Unable to check BP today but asymptomatic.  Obesity- pt is down 14 lbs since last visit.  Pt reports walking regularly.  Hyperlipidemia- chronic problem.  Pt stopped statin ~3 weeks ago due to knee pain/swelling and 'residue' in back of throat.  Sxs stopped after stopping medication.  Denies abd pain, N/V.  ROS:   See pertinent positives and negatives per HPI.  Patient Active Problem List   Diagnosis Date Noted  . OSA (obstructive sleep apnea) 04/22/2019  . Diet-controlled diabetes mellitus ([place]) 07/11/2015  . Sciatica of left side 03/04/2014  . URI (upper respiratory infection) 12/29/2012  . Uvular hypertrophy 07/24/2012  . Metatarsalgia 10/20/2011  . General medical examination 10/18/2011  . Hyperlipidemia associated with type 2 diabetes mellitus ([place]) 09/14/2010  . SWELLING MASS OR LUMP IN HEAD AND NECK 06/19/2010  . BACK PAIN 06/21/2009  . RHINITIS 04/12/2009  . Essential hypertension 02/11/2007    Social History   Tobacco Use  . Smoking status: Never Smoker  . Smokeless tobacco: Never Used  Substance Use Topics  .  Alcohol use: Yes    Comment: on occasion    Current Outpatient Medications:  .  amLODipine (NORVASC) 10 MG tablet, TAKE 1 TABLET DAILY, Disp: 90 tablet, Rfl: 1 .  carvedilol (COREG) 6.25 MG tablet, TAKE 1 TABLET TWICE A DAY WITH MEALS, Disp: 180 tablet, Rfl: 1 .  fluticasone (FLONASE) 50 MCG/ACT nasal spray, Place 2 sprays into both nostrils daily., Disp: 16 g, Rfl: 12 .  Multiple Vitamins-Minerals (PRESERVISION AREDS 2) CAPS, Take by mouth., Disp: , Rfl:  .  Omega-3 Fatty Acids (FISH OIL) 1200 MG CAPS, Take by mouth daily.  , Disp: , Rfl:  .  Potassium Gluconate 550 MG TABS, Take by mouth daily.  , Disp: , Rfl:  .  atorvastatin (LIPITOR) 20 MG tablet, TAKE 1 TABLET DAILY (Patient not taking: Reported on 05/05/2019), Disp: 90 tablet, Rfl: 1  Allergies  Allergen Reactions  . Lisinopril     REACTION: angioedema    Objective:   Ht 6\' 1"  (1.854 m)   Wt 248 lb (112.5 kg)   BMI 32.72 kg/m   AAOx3, NAD NCAT, EOMI No obvious CN deficits Coloring WNL Pt is able to speak clearly, coherently without shortness of breath or increased work of breathing.  Thought process is linear.  Mood is appropriate.   Assessment and Plan:   DM- chronic problem.  Thus far has been able to control w/ diet and exercise.  UTD on foot exam, microalbumin.  Due for eye exam but unable to schedule at this time.  Will follow.  HTN- chronic problem.  Unable to check BP today but  currently asymptomatic.  No anticipated med changes.  Hyperlipidemia- chronic problem.  Once Lipitor arrived from mail order he developed bilateral knee pain and swelling.  He had pain to the point of being unable to walk.  Stopped medication and sxs improved.  Will follow lipid #s.  Obesity- pt is down 14 lbs since last visit.  Applauded his efforts.  Will continue to follow.   Annye Asa, MD 05/05/2019

## 2019-05-05 NOTE — Progress Notes (Signed)
I have discussed the procedure for the virtual visit with the patient who has given consent to proceed with assessment and treatment.   Jessica L Brodmerkel, CMA     

## 2019-05-10 ENCOUNTER — Other Ambulatory Visit: Payer: PRIVATE HEALTH INSURANCE

## 2019-05-12 ENCOUNTER — Other Ambulatory Visit (INDEPENDENT_AMBULATORY_CARE_PROVIDER_SITE_OTHER): Payer: PRIVATE HEALTH INSURANCE

## 2019-05-12 DIAGNOSIS — E119 Type 2 diabetes mellitus without complications: Secondary | ICD-10-CM

## 2019-05-12 LAB — BASIC METABOLIC PANEL
BUN: 18 mg/dL (ref 6–23)
CO2: 28 mEq/L (ref 19–32)
Calcium: 8.8 mg/dL (ref 8.4–10.5)
Chloride: 104 mEq/L (ref 96–112)
Creatinine, Ser: 1.08 mg/dL (ref 0.40–1.50)
GFR: 87.81 mL/min (ref >60.00)
Glucose, Bld: 100 mg/dL — ABNORMAL HIGH (ref 70–99)
Potassium: 4.5 mEq/L (ref 3.5–5.1)
Sodium: 140 mEq/L (ref 135–145)

## 2019-05-12 LAB — HEMOGLOBIN A1C: Hgb A1c MFr Bld: 6.1 % (ref 4.6–6.5)

## 2019-06-22 ENCOUNTER — Other Ambulatory Visit: Payer: Self-pay | Admitting: Family Medicine

## 2019-08-09 ENCOUNTER — Other Ambulatory Visit: Payer: Self-pay

## 2019-08-09 ENCOUNTER — Ambulatory Visit (INDEPENDENT_AMBULATORY_CARE_PROVIDER_SITE_OTHER): Payer: PRIVATE HEALTH INSURANCE | Admitting: Family Medicine

## 2019-08-09 ENCOUNTER — Encounter: Payer: Self-pay | Admitting: Family Medicine

## 2019-08-09 DIAGNOSIS — I10 Essential (primary) hypertension: Secondary | ICD-10-CM | POA: Diagnosis not present

## 2019-08-09 DIAGNOSIS — E119 Type 2 diabetes mellitus without complications: Secondary | ICD-10-CM | POA: Diagnosis not present

## 2019-08-09 DIAGNOSIS — E785 Hyperlipidemia, unspecified: Secondary | ICD-10-CM

## 2019-08-09 DIAGNOSIS — E1169 Type 2 diabetes mellitus with other specified complication: Secondary | ICD-10-CM | POA: Diagnosis not present

## 2019-08-09 NOTE — Progress Notes (Signed)
I have discussed the procedure for the virtual visit with the patient who has given consent to proceed with assessment and treatment.   Pt unable to obtain vitals.   Jessica L Brodmerkel, CMA     

## 2019-08-09 NOTE — Progress Notes (Signed)
Virtual Visit via Video   I connected with patient on 08/09/19 at  9:30 AM EDT by a video enabled telemedicine application and verified that I am speaking with the correct person using two identifiers.  Location patient: Home Location provider: Acupuncturist, Office Persons participating in the virtual visit: Patient, Provider, [place] (Jess B)  I discussed the limitations of evaluation and management by telemedicine and the availability of in person appointments. The patient expressed understanding and agreed to proceed.  Subjective:   HPI:   DM- chronic problem.  Currently diet controlled.  UTD on foot exam, eye exam, microalbumin.  Walking daily 30-40 minutes.  No changes or sores on feet.  No numbness/tingling of hands/feet  Hyperlipidemia- not currently on statin due to joint pains.  Due for recheck of lipids.  No abd pain, N/V  HTN- chronic problem.  Pt unable to check BP today.  On Amlodipine daily and Coreg BID.  No CP, SOB, HAs, visual changes, edema.  ROS:   See pertinent positives and negatives per HPI.  Patient Active Problem List   Diagnosis Date Noted  . Obesity (BMI 30-39.9) 05/05/2019  . OSA (obstructive sleep apnea) 04/22/2019  . Diet-controlled diabetes mellitus ([place]) 07/11/2015  . Sciatica of left side 03/04/2014  . URI (upper respiratory infection) 12/29/2012  . Uvular hypertrophy 07/24/2012  . Metatarsalgia 10/20/2011  . General medical examination 10/18/2011  . Hyperlipidemia associated with type 2 diabetes mellitus ([place]) 09/14/2010  . SWELLING MASS OR LUMP IN HEAD AND NECK 06/19/2010  . BACK PAIN 06/21/2009  . RHINITIS 04/12/2009  . Essential hypertension 02/11/2007    Social History   Tobacco Use  . Smoking status: Never Smoker  . Smokeless tobacco: Never Used  Substance Use Topics  . Alcohol use: Yes    Comment: on occasion    Current Outpatient Medications:  .  amLODipine (NORVASC) 10 MG tablet, TAKE 1 TABLET DAILY, Disp: 90 tablet,  Rfl: 3 .  atorvastatin (LIPITOR) 20 MG tablet, TAKE 1 TABLET DAILY, Disp: 90 tablet, Rfl: 3 .  carvedilol (COREG) 6.25 MG tablet, TAKE 1 TABLET TWICE A DAY WITH MEALS, Disp: 180 tablet, Rfl: 3 .  fluticasone (FLONASE) 50 MCG/ACT nasal spray, Place 2 sprays into both nostrils daily., Disp: 16 g, Rfl: 12 .  Multiple Vitamins-Minerals (PRESERVISION AREDS 2) CAPS, Take by mouth., Disp: , Rfl:  .  Omega-3 Fatty Acids (FISH OIL) 1200 MG CAPS, Take by mouth daily.  , Disp: , Rfl:  .  Potassium Gluconate 550 MG TABS, Take by mouth daily.  , Disp: , Rfl:   Allergies  Allergen Reactions  . Lisinopril     REACTION: angioedema    Objective:   There were no vitals taken for this visit.  AAOx3, NAD NCAT, EOMI No obvious CN deficits Coloring WNL Pt is able to speak clearly, coherently without shortness of breath or increased work of breathing.  Thought process is linear.  Mood is appropriate.   Assessment and Plan:   HTN- chronic problem.  Unable to check BP today.  Asymptomatic.  Tolerating Amlodipine and Coreg w/o difficulty.  Hyperlipidemia- chronic problem.  Did not tolerate Lipitor previously.  Due to repeat labs and determine if statin is needed.  Pt expressed understanding and is in agreement w/ plan.   DM- chronic problem.  Currently diet controlled.  UTD on foot exam, eye exam, microalbumin.  Applauded his efforts at daily exercise.  Check labs.  Adjust tx plan prn.   Annye Asa, MD  08/09/2019   

## 2019-08-10 ENCOUNTER — Ambulatory Visit: Payer: PRIVATE HEALTH INSURANCE

## 2019-08-11 ENCOUNTER — Ambulatory Visit (INDEPENDENT_AMBULATORY_CARE_PROVIDER_SITE_OTHER): Payer: PRIVATE HEALTH INSURANCE

## 2019-08-11 ENCOUNTER — Other Ambulatory Visit: Payer: Self-pay

## 2019-08-11 DIAGNOSIS — E785 Hyperlipidemia, unspecified: Secondary | ICD-10-CM

## 2019-08-11 DIAGNOSIS — E1169 Type 2 diabetes mellitus with other specified complication: Secondary | ICD-10-CM | POA: Diagnosis not present

## 2019-08-11 DIAGNOSIS — E119 Type 2 diabetes mellitus without complications: Secondary | ICD-10-CM | POA: Diagnosis not present

## 2019-08-11 DIAGNOSIS — I10 Essential (primary) hypertension: Secondary | ICD-10-CM

## 2019-08-11 LAB — LIPID PANEL
Cholesterol: 123 mg/dL (ref 0–200)
HDL: 41.3 mg/dL (ref >39.00)
LDL Cholesterol: 71 mg/dL (ref 0–99)
NonHDL: 81.43
Total CHOL/HDL Ratio: 3
Triglycerides: 50 mg/dL (ref 0.0–149.0)
VLDL: 10 mg/dL (ref 0.0–40.0)

## 2019-08-11 LAB — CBC WITH DIFFERENTIAL/PLATELET
Basophils Absolute: 0.1 10*3/uL (ref 0.0–0.1)
Basophils Relative: 1.3 % (ref 0.0–3.0)
Eosinophils Absolute: 0.2 10*3/uL (ref 0.0–0.7)
Eosinophils Relative: 3.7 % (ref 0.0–5.0)
HCT: 40 % (ref 39.0–52.0)
Hemoglobin: 13.3 g/dL (ref 13.0–17.0)
Lymphocytes Relative: 49.9 % — ABNORMAL HIGH (ref 12.0–46.0)
Lymphs Abs: 2.3 10*3/uL (ref 0.7–4.0)
MCHC: 33.1 g/dL (ref 30.0–36.0)
MCV: 83.2 fl (ref 78.0–100.0)
Monocytes Absolute: 0.4 10*3/uL (ref 0.1–1.0)
Monocytes Relative: 9.4 % (ref 3.0–12.0)
Neutro Abs: 1.7 10*3/uL (ref 1.4–7.7)
Neutrophils Relative %: 35.7 % — ABNORMAL LOW (ref 43.0–77.0)
Platelets: 212 10*3/uL (ref 150.0–400.0)
RBC: 4.81 Mil/uL (ref 4.22–5.81)
RDW: 13.1 % (ref 11.5–15.5)
WBC: 4.7 10*3/uL (ref 4.0–10.5)

## 2019-08-11 LAB — BASIC METABOLIC PANEL
BUN: 18 mg/dL (ref 6–23)
CO2: 25 mEq/L (ref 19–32)
Calcium: 9.2 mg/dL (ref 8.4–10.5)
Chloride: 103 mEq/L (ref 96–112)
Creatinine, Ser: 1.17 mg/dL (ref 0.40–1.50)
GFR: 79.98 mL/min (ref >60.00)
Glucose, Bld: 83 mg/dL (ref 70–99)
Potassium: 4.2 mEq/L (ref 3.5–5.1)
Sodium: 138 mEq/L (ref 135–145)

## 2019-08-11 LAB — HEPATIC FUNCTION PANEL
ALT: 21 U/L (ref 0–53)
AST: 19 U/L (ref 0–37)
Albumin: 4.1 g/dL (ref 3.5–5.2)
Alkaline Phosphatase: 48 U/L (ref 39–117)
Bilirubin, Direct: 0.1 mg/dL (ref 0.0–0.3)
Total Bilirubin: 0.6 mg/dL (ref 0.2–1.2)
Total Protein: 6.8 g/dL (ref 6.0–8.3)

## 2019-08-11 LAB — HEMOGLOBIN A1C: Hgb A1c MFr Bld: 6.2 % (ref 4.6–6.5)

## 2019-08-11 LAB — TSH: TSH: 2.61 u[IU]/mL (ref 0.35–4.50)

## 2019-11-17 ENCOUNTER — Ambulatory Visit (INDEPENDENT_AMBULATORY_CARE_PROVIDER_SITE_OTHER): Payer: PRIVATE HEALTH INSURANCE | Admitting: Family Medicine

## 2019-11-17 ENCOUNTER — Encounter: Payer: Self-pay | Admitting: Family Medicine

## 2019-11-17 VITALS — Ht 73.0 in | Wt 248.0 lb

## 2019-11-17 DIAGNOSIS — E119 Type 2 diabetes mellitus without complications: Secondary | ICD-10-CM

## 2019-11-17 DIAGNOSIS — R252 Cramp and spasm: Secondary | ICD-10-CM

## 2019-11-17 NOTE — Progress Notes (Signed)
Virtual Visit via Video   I connected with patient on 11/17/19 at  8:30 AM EST by a video enabled telemedicine application and verified that I am speaking with the correct person using two identifiers.  Location patient: Home Location provider: Acupuncturist, Office Persons participating in the virtual visit: Patient, Provider, [place] (Katie N)  I discussed the limitations of evaluation and management by telemedicine and the availability of in person appointments. The patient expressed understanding and agreed to proceed.  Subjective:   HPI:   DM- chronic problem.  Currently diet controlled.  UTD on eye exam, due for foot exam and urine microalbumin.  Exercising regularly.  No CP, SOB, HAs, visual changes, numbness/tingling of hands/feet.  No abd pain, N/V.  Leg cramps- 'they are kicking my butt'.  Taking potassium daily.  Pt reports good water intake.  Pt feels meds from [facility] are different.  He feels that either the Lipitor or Coreg is part of the issue.  ROS:   See pertinent positives and negatives per HPI.  Patient Active Problem List   Diagnosis Date Noted  . Obesity (BMI 30-39.9) 05/05/2019  . OSA (obstructive sleep apnea) 04/22/2019  . Diet-controlled diabetes mellitus ([place]) 07/11/2015  . Sciatica of left side 03/04/2014  . URI (upper respiratory infection) 12/29/2012  . Uvular hypertrophy 07/24/2012  . Metatarsalgia 10/20/2011  . General medical examination 10/18/2011  . Hyperlipidemia associated with type 2 diabetes mellitus ([place]) 09/14/2010  . SWELLING MASS OR LUMP IN HEAD AND NECK 06/19/2010  . BACK PAIN 06/21/2009  . RHINITIS 04/12/2009  . Essential hypertension 02/11/2007    Social History   Tobacco Use  . Smoking status: Never Smoker  . Smokeless tobacco: Never Used  Substance Use Topics  . Alcohol use: Yes    Comment: on occasion    Current Outpatient Medications:  .  amLODipine (NORVASC) 10 MG tablet, TAKE 1 TABLET DAILY, Disp: 90  tablet, Rfl: 3 .  atorvastatin (LIPITOR) 20 MG tablet, TAKE 1 TABLET DAILY, Disp: 90 tablet, Rfl: 3 .  carvedilol (COREG) 6.25 MG tablet, TAKE 1 TABLET TWICE A DAY WITH MEALS, Disp: 180 tablet, Rfl: 3 .  fluticasone (FLONASE) 50 MCG/ACT nasal spray, Place 2 sprays into both nostrils daily., Disp: 16 g, Rfl: 12 .  Multiple Vitamins-Minerals (PRESERVISION AREDS 2) CAPS, Take by mouth., Disp: , Rfl:  .  Omega-3 Fatty Acids (FISH OIL) 1200 MG CAPS, Take by mouth daily.  , Disp: , Rfl:  .  Potassium Gluconate 550 MG TABS, Take by mouth daily.  , Disp: , Rfl:   Allergies  Allergen Reactions  . Lisinopril     REACTION: angioedema    Objective:   Ht 6\' 1"  (1.854 m)   Wt 248 lb (112.5 kg)   BMI 32.72 kg/m  AAOx3, NAD NCAT, EOMI No obvious CN deficits Coloring WNL Pt is able to speak clearly, coherently without shortness of breath or increased work of breathing.  Thought process is linear.  Mood is appropriate.   Assessment and Plan:   Diet Controlled DM- chronic problem.  UTD on eye exam.  Unable to do foot exam virtually.  Will get microalbumin when he returns for labs.  Encouraged healthy diet and regular exercise.  Check labs and adjust tx plan prn.  Leg cramps- new. pt reports good water intake and is taking potassium supplement.  Will add OTC Mag supplement and check labs.  If no relief w/ Magnesium, he will stop statin and monitor for improvement.  Pt expressed understanding and is in agreement w/ plan.    Annye Asa, MD 11/17/2019

## 2019-11-22 ENCOUNTER — Other Ambulatory Visit: Payer: Self-pay

## 2019-11-22 ENCOUNTER — Encounter: Payer: Self-pay | Admitting: Family Medicine

## 2019-11-22 DIAGNOSIS — Z20822 Contact with and (suspected) exposure to covid-19: Secondary | ICD-10-CM

## 2019-11-23 ENCOUNTER — Ambulatory Visit: Payer: PRIVATE HEALTH INSURANCE

## 2019-11-24 ENCOUNTER — Encounter: Payer: Self-pay | Admitting: Family Medicine

## 2019-11-24 LAB — NOVEL CORONAVIRUS, NAA: SARS-CoV-2, NAA: NOT DETECTED

## 2019-11-29 ENCOUNTER — Other Ambulatory Visit: Payer: Self-pay

## 2019-11-29 DIAGNOSIS — Z20822 Contact with and (suspected) exposure to covid-19: Secondary | ICD-10-CM

## 2019-12-01 ENCOUNTER — Telehealth: Payer: Self-pay | Admitting: Family Medicine

## 2019-12-01 LAB — NOVEL CORONAVIRUS, NAA: SARS-CoV-2, NAA: NOT DETECTED

## 2019-12-01 NOTE — Telephone Encounter (Signed)
Lm to schedule labs - flu shot and also a 3-44m follow up

## 2020-01-18 ENCOUNTER — Encounter: Payer: Self-pay | Admitting: Family Medicine

## 2020-02-07 ENCOUNTER — Ambulatory Visit (INDEPENDENT_AMBULATORY_CARE_PROVIDER_SITE_OTHER): Payer: PRIVATE HEALTH INSURANCE | Admitting: Family Medicine

## 2020-02-07 ENCOUNTER — Encounter: Payer: Self-pay | Admitting: Gastroenterology

## 2020-02-07 ENCOUNTER — Encounter: Payer: Self-pay | Admitting: Family Medicine

## 2020-02-07 ENCOUNTER — Other Ambulatory Visit: Payer: Self-pay

## 2020-02-07 VITALS — BP 136/89 | HR 70 | Temp 97.9°F | Resp 16 | Ht 73.0 in | Wt 273.4 lb

## 2020-02-07 DIAGNOSIS — E1169 Type 2 diabetes mellitus with other specified complication: Secondary | ICD-10-CM | POA: Diagnosis not present

## 2020-02-07 DIAGNOSIS — E785 Hyperlipidemia, unspecified: Secondary | ICD-10-CM

## 2020-02-07 DIAGNOSIS — Z125 Encounter for screening for malignant neoplasm of prostate: Secondary | ICD-10-CM

## 2020-02-07 DIAGNOSIS — E119 Type 2 diabetes mellitus without complications: Secondary | ICD-10-CM

## 2020-02-07 DIAGNOSIS — Z0000 Encounter for general adult medical examination without abnormal findings: Secondary | ICD-10-CM

## 2020-02-07 DIAGNOSIS — Z1211 Encounter for screening for malignant neoplasm of colon: Secondary | ICD-10-CM

## 2020-02-07 NOTE — Assessment & Plan Note (Signed)
Chronic problem.  Tolerating statin w/o difficulty.  Check labs.  Adjust meds prn  

## 2020-02-07 NOTE — Patient Instructions (Addendum)
Follow up in 6 months to recheck BP, cholesterol, and diabetes We'll notify you of your lab results and make any changes if needed Continue to work on healthy diet and regular exercise- you can do it! We'll call you with your GI referral for colonoscopy- [facility]!! Call with any questions or concerns Stay Safe!  Stay Healthy!

## 2020-02-07 NOTE — Assessment & Plan Note (Signed)
Chronic problem.  UTD on eye exam.  Foot exam done today.  Will get microalbumin.

## 2020-02-07 NOTE — Assessment & Plan Note (Signed)
Pt's PE WNL w/ exception of obesity.  UTD on immunizations.  Due for colonoscopy- referral placed.  Check labs.  Anticipatory guidance provided.  

## 2020-02-07 NOTE — Assessment & Plan Note (Signed)
BMI is 36.07 but w/ DM, HTN, and hyperlipidemia he qualifies as morbidly obese.  Check labs.  Will follow.

## 2020-02-07 NOTE — Progress Notes (Signed)
   Subjective:    Patient ID: Christian Henry, male    DOB: January 03, 1971, 49 y.o.   MRN: [id number]  HPI CPE- due for colonoscopy now that he is 40.  Also due for foot exam and microalbumin   Review of Systems Patient reports no vision/hearing changes, anorexia, fever ,adenopathy, persistant/recurrent hoarseness, swallowing issues, chest pain, palpitations, edema, persistant/recurrent cough, hemoptysis, dyspnea (rest,exertional, paroxysmal nocturnal), gastrointestinal  bleeding (melena, rectal bleeding), abdominal pain, excessive heart burn, GU symptoms (dysuria, hematuria, voiding/incontinence issues) syncope, focal weakness, memory loss, numbness & tingling, skin/hair/nail changes, depression, anxiety, abnormal bruising/bleeding, musculoskeletal symptoms/signs.   This visit occurred during the SARS-CoV-2 public health emergency.  Safety protocols were in place, including screening questions prior to the visit, additional usage of staff PPE, and extensive cleaning of exam room while observing appropriate contact time as indicated for disinfecting solutions.       Objective:   Physical Exam General Appearance:    Alert, cooperative, no distress, appears stated age  Head:    Normocephalic, without obvious abnormality, atraumatic  Eyes:    PERRL, conjunctiva/corneas clear, EOM's intact, fundi    benign, both eyes       Ears:    Normal TM's and external ear canals, both ears  Nose:   Deferred due to COVID  Throat:   Neck:   Supple, symmetrical, trachea midline, no adenopathy;       thyroid:  No enlargement/tenderness/nodules  Back:     Symmetric, no curvature, ROM normal, no CVA tenderness  Lungs:     Clear to auscultation bilaterally, respirations unlabored  Chest wall:    No tenderness or deformity  Heart:    Regular rate and rhythm, S1 and S2 normal, no murmur, rub   or gallop  Abdomen:     Soft, non-tender, bowel sounds active all four quadrants,    no masses, no organomegaly  Genitalia:     Deferred at pt's request  Rectal:    Extremities:   Extremities normal, atraumatic, no cyanosis or edema  Pulses:   2+ and symmetric all extremities  Skin:   Skin color, texture, turgor normal, no rashes or lesions  Lymph nodes:   Cervical, supraclavicular, and axillary nodes normal  Neurologic:   CNII-XII intact. Normal strength, sensation and reflexes      throughout          Assessment & Plan:

## 2020-02-08 LAB — LIPID PANEL
Cholesterol: 142 mg/dL (ref 0–200)
HDL: 42.8 mg/dL (ref >39.00)
LDL Cholesterol: 87 mg/dL (ref 0–99)
NonHDL: 98.86
Total CHOL/HDL Ratio: 3
Triglycerides: 58 mg/dL (ref 0.0–149.0)
VLDL: 11.6 mg/dL (ref 0.0–40.0)

## 2020-02-08 LAB — CBC WITH DIFFERENTIAL/PLATELET
Basophils Absolute: 0 10*3/uL (ref 0.0–0.1)
Basophils Relative: 1 % (ref 0.0–3.0)
Eosinophils Absolute: 0.2 10*3/uL (ref 0.0–0.7)
Eosinophils Relative: 3.9 % (ref 0.0–5.0)
HCT: 39.1 % (ref 39.0–52.0)
Hemoglobin: 12.9 g/dL — ABNORMAL LOW (ref 13.0–17.0)
Lymphocytes Relative: 34.4 % (ref 12.0–46.0)
Lymphs Abs: 1.6 10*3/uL (ref 0.7–4.0)
MCHC: 33 g/dL (ref 30.0–36.0)
MCV: 83.1 fl (ref 78.0–100.0)
Monocytes Absolute: 0.5 10*3/uL (ref 0.1–1.0)
Monocytes Relative: 10.7 % (ref 3.0–12.0)
Neutro Abs: 2.4 10*3/uL (ref 1.4–7.7)
Neutrophils Relative %: 50 % (ref 43.0–77.0)
Platelets: 221 10*3/uL (ref 150.0–400.0)
RBC: 4.71 Mil/uL (ref 4.22–5.81)
RDW: 12.7 % (ref 11.5–15.5)
WBC: 4.7 10*3/uL (ref 4.0–10.5)

## 2020-02-08 LAB — MICROALBUMIN / CREATININE URINE RATIO
Creatinine,U: 136.9 mg/dL
Microalb Creat Ratio: 0.5 mg/g (ref 0.0–30.0)
Microalb, Ur: <0.7 mg/dL (ref 0.0–1.9)

## 2020-02-08 LAB — HEPATIC FUNCTION PANEL
ALT: 25 U/L (ref 0–53)
AST: 22 U/L (ref 0–37)
Albumin: 4.2 g/dL (ref 3.5–5.2)
Alkaline Phosphatase: 53 U/L (ref 39–117)
Bilirubin, Direct: 0.1 mg/dL (ref 0.0–0.3)
Total Bilirubin: 0.6 mg/dL (ref 0.2–1.2)
Total Protein: 7 g/dL (ref 6.0–8.3)

## 2020-02-08 LAB — BASIC METABOLIC PANEL
BUN: 16 mg/dL (ref 6–23)
CO2: 30 mEq/L (ref 19–32)
Calcium: 9.3 mg/dL (ref 8.4–10.5)
Chloride: 104 mEq/L (ref 96–112)
Creatinine, Ser: 1.03 mg/dL (ref 0.40–1.50)
GFR: 92.46 mL/min (ref >60.00)
Glucose, Bld: 94 mg/dL (ref 70–99)
Potassium: 4.2 mEq/L (ref 3.5–5.1)
Sodium: 140 mEq/L (ref 135–145)

## 2020-02-08 LAB — HEMOGLOBIN A1C: Hgb A1c MFr Bld: 6.3 % (ref 4.6–6.5)

## 2020-02-08 LAB — TSH: TSH: 1.32 u[IU]/mL (ref 0.35–4.50)

## 2020-02-08 LAB — PSA: PSA: 0.57 ng/mL (ref 0.10–4.00)

## 2020-02-21 ENCOUNTER — Other Ambulatory Visit: Payer: Self-pay

## 2020-02-21 ENCOUNTER — Ambulatory Visit (AMBULATORY_SURGERY_CENTER): Payer: Self-pay | Admitting: *Deleted

## 2020-02-21 VITALS — Temp 98.7°F | Ht 73.0 in | Wt 273.0 lb

## 2020-02-21 DIAGNOSIS — Z01818 Encounter for other preprocedural examination: Secondary | ICD-10-CM

## 2020-02-21 DIAGNOSIS — Z1211 Encounter for screening for malignant neoplasm of colon: Secondary | ICD-10-CM

## 2020-02-21 MED ORDER — NA SULFATE-K SULFATE-MG SULF 17.5-3.13-1.6 GM/177ML PO SOLN: ORAL | 0 refills | Status: DC

## 2020-02-21 NOTE — Progress Notes (Signed)
Patient is here in-person for PV. Patient denies any allergies to eggs or soy. Patient denies any problems with anesthesia/sedation. Patient denies any oxygen use at home. Patient denies taking any diet/weight loss medications or blood thinners. Patient is not being treated for MRSA or C-diff. EMMI education assisgned to the patient for the procedure, this was explained and instructions given to patient. COVID-19 screening test is on 3/1, the pt is aware. Pt is aware that care partner will wait in the car during procedure; if they feel like they will be too hot or cold to wait in the car; they may wait in the 4 th floor lobby. Patient is aware to bring only one care partner. We want them to wear a mask (we do not have any that we can provide them), practice social distancing, and we will check their temperatures when they get here.  I did remind the patient that their care partner needs to stay in the parking lot the entire time and have a cell phone available, we will call them when the pt is ready for discharge. Patient will wear mask into building.    Suprep $15 off coupon given to the patient.

## 2020-02-28 ENCOUNTER — Encounter: Payer: Self-pay | Admitting: Gastroenterology

## 2020-02-28 ENCOUNTER — Other Ambulatory Visit: Payer: Self-pay

## 2020-02-28 ENCOUNTER — Other Ambulatory Visit: Payer: Self-pay | Admitting: Gastroenterology

## 2020-02-28 ENCOUNTER — Ambulatory Visit (INDEPENDENT_AMBULATORY_CARE_PROVIDER_SITE_OTHER): Payer: PRIVATE HEALTH INSURANCE

## 2020-02-28 DIAGNOSIS — Z1159 Encounter for screening for other viral diseases: Secondary | ICD-10-CM

## 2020-02-29 ENCOUNTER — Encounter: Payer: PRIVATE HEALTH INSURANCE | Admitting: Gastroenterology

## 2020-03-01 LAB — SARS CORONAVIRUS 2 (TAT 6-24 HRS): SARS Coronavirus 2: NEGATIVE

## 2020-03-02 ENCOUNTER — Other Ambulatory Visit: Payer: Self-pay

## 2020-03-02 ENCOUNTER — Encounter: Payer: Self-pay | Admitting: Gastroenterology

## 2020-03-02 ENCOUNTER — Ambulatory Visit (AMBULATORY_SURGERY_CENTER): Payer: PRIVATE HEALTH INSURANCE | Admitting: Gastroenterology

## 2020-03-02 VITALS — BP 139/90 | HR 55 | Temp 96.6°F | Resp 15 | Ht 73.0 in | Wt 273.0 lb

## 2020-03-02 DIAGNOSIS — D124 Benign neoplasm of descending colon: Secondary | ICD-10-CM | POA: Diagnosis not present

## 2020-03-02 DIAGNOSIS — Z1211 Encounter for screening for malignant neoplasm of colon: Secondary | ICD-10-CM

## 2020-03-02 DIAGNOSIS — D125 Benign neoplasm of sigmoid colon: Secondary | ICD-10-CM | POA: Diagnosis not present

## 2020-03-02 MED ORDER — SODIUM CHLORIDE 0.9 % IV SOLN: 500.0000 mL | Freq: Once | INTRAVENOUS | Status: DC

## 2020-03-02 NOTE — Op Note (Signed)
[facility] Patient Name: Christian Henry Procedure Date: 03/02/2020 8:36 AM MRN: [id number] Endoscopist: Mauri Pole , MD Age: 49 Referring MD:  Date of Birth: 06-Jan-1971 Gender: Male Account #: [id number] Procedure:                Colonoscopy Indications:              Screening for colorectal malignant neoplasm Medicines:                Monitored Anesthesia Care Procedure:                Pre-Anesthesia Assessment:                           - Prior to the procedure, a History and Physical                            was performed, and patient medications and                            allergies were reviewed. The patient's tolerance of                            previous anesthesia was also reviewed. The risks                            and benefits of the procedure and the sedation                            options and risks were discussed with the patient.                            All questions were answered, and informed consent                            was obtained. Prior Anticoagulants: The patient has                            taken no previous anticoagulant or antiplatelet                            agents. ASA Grade Assessment: II - A patient with                            mild systemic disease. After reviewing the risks                            and benefits, the patient was deemed in                            satisfactory condition to undergo the procedure.                           After obtaining informed consent, the colonoscope  was passed under direct vision. Throughout the                            procedure, the patient's blood pressure, pulse, and                            oxygen saturations were monitored continuously. The                            Colonoscope was introduced through the anus and                            advanced to the the cecum, identified by                            appendiceal orifice and  ileocecal valve. The                            colonoscopy was performed without difficulty. The                            patient tolerated the procedure well. The quality                            of the bowel preparation was good. The ileocecal                            valve, appendiceal orifice, and rectum were                            photographed. Scope In: 8:45:03 AM Scope Out: 9:09:27 AM Scope Withdrawal Time: 0 hours 18 minutes 18 seconds  Total Procedure Duration: 0 hours 24 minutes 24 seconds  Findings:                 The perianal and digital rectal examinations were                            normal.                           A greater than 50 mm polyp was found in the                            descending colon. The polyp was multi-lobulated and                            pedunculated. The polyp was removed with a hot                            snare. Resection and retrieval were complete. To                            prevent bleeding after the polypectomy, one  hemostatic clip was successfully placed (MR                            conditional). There was no bleeding at the end of                            the procedure.                           Two sessile polyps were found in the sigmoid colon                            and descending colon. The polyps were 1 to 2 mm in                            size. These polyps were removed with a cold biopsy                            forceps. Resection and retrieval were complete.                           Non-bleeding internal hemorrhoids were found during                            retroflexion. The hemorrhoids were medium-sized.                           The exam was otherwise without abnormality. Complications:            No immediate complications. Estimated Blood Loss:     Estimated blood loss was minimal. Impression:               - One greater than 50 mm polyp in the descending                             colon, removed with a hot snare. Resected and                            retrieved. Clip (MR conditional) was placed.                           - Two 1 to 2 mm polyps in the sigmoid colon and in                            the descending colon, removed with a cold biopsy                            forceps. Resected and retrieved.                           - Non-bleeding internal hemorrhoids.                           - The examination was otherwise normal. Recommendation:           -  Patient has a contact number available for                            emergencies. The signs and symptoms of potential                            delayed complications were discussed with the                            patient. Return to normal activities tomorrow.                            Written discharge instructions were provided to the                            patient.                           - Resume previous diet.                           - Continue present medications.                           - Await pathology results.                           - Repeat colonoscopy in 3 years for surveillance                            based on pathology results. Mauri Pole, MD 03/02/2020 9:14:39 AM This report has been signed electronically.

## 2020-03-02 NOTE — Patient Instructions (Signed)
Handout on polyps, hemorrhoids given. Clip card given.    YOU HAD AN ENDOSCOPIC PROCEDURE TODAY AT [facility]:   Refer to the procedure report that was given to you for any specific questions about what was found during the examination.  If the procedure report does not answer your questions, please call your gastroenterologist to clarify.  If you requested that your care partner not be given the details of your procedure findings, then the procedure report has been included in a sealed envelope for you to review at your convenience later.  YOU SHOULD EXPECT: Some feelings of bloating in the abdomen. Passage of more gas than usual.  Walking can help get rid of the air that was put into your GI tract during the procedure and reduce the bloating. If you had a lower endoscopy (such as a colonoscopy or flexible sigmoidoscopy) you may notice spotting of blood in your stool or on the toilet paper. If you underwent a bowel prep for your procedure, you may not have a normal bowel movement for a few days.  Please Note:  You might notice some irritation and congestion in your nose or some drainage.  This is from the oxygen used during your procedure.  There is no need for concern and it should clear up in a day or so.  SYMPTOMS TO REPORT IMMEDIATELY:   Following lower endoscopy (colonoscopy or flexible sigmoidoscopy):  Excessive amounts of blood in the stool  Significant tenderness or worsening of abdominal pains  Swelling of the abdomen that is new, acute  Fever of 100F or higher   For urgent or emergent issues, a gastroenterologist can be reached at any hour by calling [phone]. Do not use MyChart messaging for urgent concerns.    DIET:  We do recommend a small meal at first, but then you may proceed to your regular diet.  Drink plenty of fluids but you should avoid alcoholic beverages for 24 hours.  ACTIVITY:  You should plan to take it easy for the rest of today and you  should NOT DRIVE or use heavy machinery until tomorrow (because of the sedation medicines used during the test).    FOLLOW UP: Our staff will call the number listed on your records 48-72 hours following your procedure to check on you and address any questions or concerns that you may have regarding the information given to you following your procedure. If we do not reach you, we will leave a message.  We will attempt to reach you two times.  During this call, we will ask if you have developed any symptoms of COVID 19. If you develop any symptoms (ie: fever, flu-like symptoms, shortness of breath, cough etc.) before then, please call [phone].  If you test positive for Covid 19 in the 2 weeks post procedure, please call and report this information to [place].    If any biopsies were taken you will be contacted by phone or by letter within the next 1-3 weeks.  Please call us at [phone] if you have not heard about the biopsies in 3 weeks.    SIGNATURES/CONFIDENTIALITY: You and/or your care partner have signed paperwork which will be entered into your electronic medical record.  These signatures attest to the fact that that the information above on your After Visit Summary has been reviewed and is understood.  Full responsibility of the confidentiality of this discharge information lies with you and/or your care-partner.

## 2020-03-02 NOTE — Progress Notes (Signed)
Pt's states no medical or surgical changes since previsit or office visit.   Temp-JB  V/S-KA   Pt. Drunk 16 oz of fluids at 6:45 am,made staff aware and pt. And care partner that procedure may be delayed.

## 2020-03-02 NOTE — Progress Notes (Signed)
Called to room to assist during endoscopic procedure.  Patient ID and intended procedure confirmed with present staff. Received instructions for my participation in the procedure from the performing physician.  

## 2020-03-02 NOTE — Progress Notes (Signed)
Pt Drowsy. VSS. To PACU, report to RN. No anesthetic complications noted.  

## 2020-03-06 ENCOUNTER — Telehealth: Payer: Self-pay

## 2020-03-06 NOTE — Telephone Encounter (Signed)
Covid-19 screening questions   Do you now or have you had a fever in the last 14 days? No.  Do you have any respiratory symptoms of shortness of breath or cough now or in the last 14 days? No.  Do you have any family members or close contacts with diagnosed or suspected Covid-19 in the past 14 days? No.  Have you been tested for Covid-19 and found to be positive? No.       Follow up Call-  Call back number 03/02/2020  Post procedure Call Back phone  # [phone]  Permission to leave phone message Yes  Some recent data might be hidden     Patient questions:  Do you have a fever, pain , or abdominal swelling? No. Pain Score  0 *  Have you tolerated food without any problems? Yes.    Have you been able to return to your normal activities? Yes.    Do you have any questions about your discharge instructions: Diet   No. Medications  No. Follow up visit  No.  Do you have questions or concerns about your Care? No.  Actions: * If pain score is 4 or above: No action needed, pain <4.

## 2020-03-10 ENCOUNTER — Encounter: Payer: Self-pay | Admitting: Gastroenterology

## 2020-03-14 ENCOUNTER — Encounter: Payer: Self-pay | Admitting: Family Medicine

## 2020-05-02 ENCOUNTER — Telehealth: Payer: Self-pay | Admitting: Family Medicine

## 2020-05-02 MED ORDER — ATORVASTATIN CALCIUM 20 MG PO TABS: 20.0000 mg | ORAL_TABLET | Freq: Every day | ORAL | 1 refills | Status: DC

## 2020-05-02 MED ORDER — AMLODIPINE BESYLATE 10 MG PO TABS: 10.0000 mg | ORAL_TABLET | Freq: Every day | ORAL | 1 refills | Status: DC

## 2020-05-02 MED ORDER — CARVEDILOL 6.25 MG PO TABS: 6.2500 mg | ORAL_TABLET | Freq: Two times a day (BID) | ORAL | 1 refills | Status: DC

## 2020-05-02 MED ORDER — FLUTICASONE PROPIONATE 50 MCG/ACT NA SUSP: 2.0000 | Freq: Every day | NASAL | 6 refills | Status: AC

## 2020-05-02 NOTE — Telephone Encounter (Signed)
Pt called in asking for his meds to be sent to the [facility] of [facility] on [address]. He has changed pharmacies.

## 2020-05-02 NOTE — Telephone Encounter (Signed)
Medication filled to pharmacy as requested.   

## 2020-05-11 LAB — HM DIABETES EYE EXAM

## 2020-08-09 ENCOUNTER — Ambulatory Visit: Payer: PRIVATE HEALTH INSURANCE | Admitting: Family Medicine

## 2020-09-15 ENCOUNTER — Ambulatory Visit: Payer: PRIVATE HEALTH INSURANCE | Admitting: Family Medicine

## 2020-09-27 ENCOUNTER — Ambulatory Visit: Payer: PRIVATE HEALTH INSURANCE | Admitting: Family Medicine

## 2020-10-11 ENCOUNTER — Other Ambulatory Visit: Payer: Self-pay

## 2020-10-11 ENCOUNTER — Encounter: Payer: Self-pay | Admitting: Family Medicine

## 2020-10-11 ENCOUNTER — Ambulatory Visit: Payer: PRIVATE HEALTH INSURANCE | Admitting: Family Medicine

## 2020-10-11 VITALS — BP 128/82 | HR 80 | Temp 97.9°F | Resp 16 | Ht 73.0 in | Wt 264.2 lb

## 2020-10-11 DIAGNOSIS — E119 Type 2 diabetes mellitus without complications: Secondary | ICD-10-CM | POA: Diagnosis not present

## 2020-10-11 DIAGNOSIS — I10 Essential (primary) hypertension: Secondary | ICD-10-CM

## 2020-10-11 DIAGNOSIS — E1169 Type 2 diabetes mellitus with other specified complication: Secondary | ICD-10-CM

## 2020-10-11 DIAGNOSIS — E785 Hyperlipidemia, unspecified: Secondary | ICD-10-CM

## 2020-10-11 LAB — BASIC METABOLIC PANEL
BUN: 11 mg/dL (ref 6–23)
CO2: 28 mEq/L (ref 19–32)
Calcium: 9.1 mg/dL (ref 8.4–10.5)
Chloride: 106 mEq/L (ref 96–112)
Creatinine, Ser: 0.96 mg/dL (ref 0.40–1.50)
GFR: 91.3 mL/min (ref >60.00)
Glucose, Bld: 99 mg/dL (ref 70–99)
Potassium: 3.9 mEq/L (ref 3.5–5.1)
Sodium: 141 mEq/L (ref 135–145)

## 2020-10-11 LAB — CBC WITH DIFFERENTIAL/PLATELET
Basophils Absolute: 0 10*3/uL (ref 0.0–0.1)
Basophils Relative: 0.5 % (ref 0.0–3.0)
Eosinophils Absolute: 0.1 10*3/uL (ref 0.0–0.7)
Eosinophils Relative: 3.2 % (ref 0.0–5.0)
HCT: 37.4 % — ABNORMAL LOW (ref 39.0–52.0)
Hemoglobin: 12.5 g/dL — ABNORMAL LOW (ref 13.0–17.0)
Lymphocytes Relative: 30.4 % (ref 12.0–46.0)
Lymphs Abs: 1.4 10*3/uL (ref 0.7–4.0)
MCHC: 33.4 g/dL (ref 30.0–36.0)
MCV: 83.7 fl (ref 78.0–100.0)
Monocytes Absolute: 0.4 10*3/uL (ref 0.1–1.0)
Monocytes Relative: 9.6 % (ref 3.0–12.0)
Neutro Abs: 2.5 10*3/uL (ref 1.4–7.7)
Neutrophils Relative %: 56.3 % (ref 43.0–77.0)
Platelets: 188 10*3/uL (ref 150.0–400.0)
RBC: 4.46 Mil/uL (ref 4.22–5.81)
RDW: 13.4 % (ref 11.5–15.5)
WBC: 4.5 10*3/uL (ref 4.0–10.5)

## 2020-10-11 LAB — TSH: TSH: 1.17 u[IU]/mL (ref 0.35–4.50)

## 2020-10-11 LAB — LIPID PANEL
Cholesterol: 119 mg/dL (ref 0–200)
HDL: 40.6 mg/dL (ref >39.00)
LDL Cholesterol: 68 mg/dL (ref 0–99)
NonHDL: 78.12
Total CHOL/HDL Ratio: 3
Triglycerides: 50 mg/dL (ref 0.0–149.0)
VLDL: 10 mg/dL (ref 0.0–40.0)

## 2020-10-11 LAB — HEPATIC FUNCTION PANEL
ALT: 28 U/L (ref 0–53)
AST: 27 U/L (ref 0–37)
Albumin: 4.2 g/dL (ref 3.5–5.2)
Alkaline Phosphatase: 52 U/L (ref 39–117)
Bilirubin, Direct: 0.2 mg/dL (ref 0.0–0.3)
Total Bilirubin: 0.7 mg/dL (ref 0.2–1.2)
Total Protein: 6.9 g/dL (ref 6.0–8.3)

## 2020-10-11 LAB — HEMOGLOBIN A1C: Hgb A1c MFr Bld: 6.4 % (ref 4.6–6.5)

## 2020-10-11 NOTE — Assessment & Plan Note (Signed)
Chronic problem, on Lipitor 20mg  daily and Fish Oil 1200mg .  Exercising regularly.  Check labs.  Adjust meds prn

## 2020-10-11 NOTE — Patient Instructions (Signed)
Schedule your complete physical in 6 months We'll notify you of your lab results and make any changes if needed Continue to work on healthy diet and regular exercise- you're doing great! Try and switch all the meds to the 2nd dose (with food) and see if the stomach upset improves Call with any questions or concerns Stay Safe!  Stay Healthy!

## 2020-10-11 NOTE — Assessment & Plan Note (Signed)
Chronic problem.  Last A1C 6.3.  UTD on foot exam, eye exam, microalbumin.  Check labs and start meds as needed.

## 2020-10-11 NOTE — Assessment & Plan Note (Signed)
Chronic problem.  Well controlled today on Amlodipine 10mg  daily and Coreg 6.25mg  BID.  Currently asymptomatic.  Check labs.  No anticipated med changes.  Will follow.

## 2020-10-11 NOTE — Progress Notes (Signed)
   Subjective:    Patient ID: Christian Henry, male    DOB: April 04, 1971, 49 y.o.   MRN: [id number]  HPI HTN- chronic problem, on Amlodipine 10mg  daily, Coreg 6.25mg  BID w/ good control.  No CP, SOB, HAs, visual changes, edema.  Hyperlipidemia- chronic problem, on Lipitor 20 daily, Fish Oil 1200mg .  No abd pain w/ exception of meds on empty stomach.  No N/V.  DM- chronic problem, attempting to control w/ diet and exercise.  He is down 9 lbs since last visit.  UTD on eye exam, UTD on foot exam.  UTD on microalbumin.  Exercising regularly.  No numbness/tingling of hands/feet.   Review of Systems For ROS see HPI   This visit occurred during the SARS-CoV-2 public health emergency.  Safety protocols were in place, including screening questions prior to the visit, additional usage of staff PPE, and extensive cleaning of exam room while observing appropriate contact time as indicated for disinfecting solutions.       Objective:   Physical Exam        Assessment & Plan:

## 2020-10-11 NOTE — Assessment & Plan Note (Signed)
Improving.  Pt is down 9 lbs since last visit.  Applauded his efforts at healthy diet and regular exercise.  Will continue to follow.

## 2020-10-12 ENCOUNTER — Encounter: Payer: Self-pay | Admitting: General Practice

## 2020-11-01 ENCOUNTER — Other Ambulatory Visit: Payer: Self-pay | Admitting: Family Medicine

## 2020-11-29 ENCOUNTER — Ambulatory Visit: Payer: PRIVATE HEALTH INSURANCE | Admitting: Physician Assistant

## 2020-12-26 ENCOUNTER — Other Ambulatory Visit (HOSPITAL_BASED_OUTPATIENT_CLINIC_OR_DEPARTMENT_OTHER): Payer: Self-pay | Admitting: Internal Medicine

## 2020-12-26 ENCOUNTER — Ambulatory Visit: Payer: PRIVATE HEALTH INSURANCE | Attending: Internal Medicine

## 2020-12-26 DIAGNOSIS — Z23 Encounter for immunization: Secondary | ICD-10-CM

## 2020-12-26 NOTE — Progress Notes (Signed)
   Covid-19 Vaccination Clinic  Name:  Christian Henry    MRN: [id number] DOB: 14-Feb-1971  12/26/2020  [name] was observed post Covid-19 immunization for 15 minutes without incident. He was provided with Vaccine Information Sheet and instruction to access the V-Safe system.  Vaccinated by Fredirick Maudlin  [name] was instructed to call 911 with any severe reactions post vaccine: Marland Kitchen Difficulty breathing  . Swelling of face and throat  . A fast heartbeat  . A bad rash all over body  . Dizziness and weakness   Immunizations Administered    Name Date Dose VIS Date Route   Pfizer COVID-19 Vaccine 12/26/2020  1:49 PM 0.3 mL 10/18/2020 Intramuscular   Manufacturer: [employer], [employer]   Lot: [id number]   NDC: [address]-1

## 2020-12-27 MED FILL — PFIZER-BIONTECH COVID-19 VA: 30 | 21 days supply | Qty: 0 | Fill #0

## 2021-01-15 ENCOUNTER — Encounter: Payer: Self-pay | Admitting: Family Medicine

## 2021-01-15 ENCOUNTER — Telehealth (INDEPENDENT_AMBULATORY_CARE_PROVIDER_SITE_OTHER): Payer: PRIVATE HEALTH INSURANCE | Admitting: Family Medicine

## 2021-01-15 DIAGNOSIS — Z20822 Contact with and (suspected) exposure to covid-19: Secondary | ICD-10-CM

## 2021-01-15 NOTE — Progress Notes (Signed)
   Virtual Visit via Video   I connected with patient on 01/15/21 at  2:30 PM EST by a video enabled telemedicine application and verified that I am speaking with the correct person using two identifiers.  Location patient: Home Location provider: [facility], Office Persons participating in the virtual visit: Patient, Provider, [place] (Sabrina M)  I discussed the limitations of evaluation and management by telemedicine and the availability of in person appointments. The patient expressed understanding and agreed to proceed.  Subjective:   HPI:   URI- sxs started Thursday w/ body aches.  Got a COVID test that evening.  'I sweat it out' overnight Thursday and Friday'.  Pt describes soaking sweats.  Pt reports he initially had congestion but that has resolved.  Has improved w/ hydration.  Denies chest congestion/cough.  Denies sore throat.  Daughter is now developing symptoms.  COVID results not available at this time  ROS:   See pertinent positives and negatives per HPI.  Patient Active Problem List   Diagnosis Date Noted  . Leg cramps 11/17/2019  . Morbid obesity ([place]) 05/05/2019  . OSA (obstructive sleep apnea) 04/22/2019  . Diet-controlled diabetes mellitus ([place]) 07/11/2015  . Sciatica of left side 03/04/2014  . URI (upper respiratory infection) 12/29/2012  . Uvular hypertrophy 07/24/2012  . Metatarsalgia 10/20/2011  . General medical examination 10/18/2011  . Hyperlipidemia associated with type 2 diabetes mellitus ([place]) 09/14/2010  . SWELLING MASS OR LUMP IN HEAD AND NECK 06/19/2010  . BACK PAIN 06/21/2009  . RHINITIS 04/12/2009  . Essential hypertension 02/11/2007    Social History   Tobacco Use  . Smoking status: Never Smoker  . Smokeless tobacco: Never Used  Substance Use Topics  . Alcohol use: Yes    Alcohol/week: 4.0 standard drinks    Types: 4 Cans of beer per week    Current Outpatient Medications:  .  amLODipine (NORVASC) 10 MG tablet, TAKE 1 TABLET  BY MOUTH EVERY DAY, Disp: 30 tablet, Rfl: 5 .  atorvastatin (LIPITOR) 20 MG tablet, Take 1 tablet (20 mg total) by mouth daily., Disp: 90 tablet, Rfl: 1 .  carvedilol (COREG) 6.25 MG tablet, TAKE 1 TABLET (6.25 MG TOTAL) BY MOUTH 2 (TWO) TIMES DAILY WITH A MEAL., Disp: 60 tablet, Rfl: 5 .  fluticasone (FLONASE) 50 MCG/ACT nasal spray, Place 2 sprays into both nostrils daily., Disp: 16 g, Rfl: 6 .  MAGNESIUM PO, Take by mouth., Disp: , Rfl:  .  Multiple Vitamins-Minerals (PRESERVISION AREDS 2) CAPS, Take by mouth., Disp: , Rfl:  .  Omega-3 Fatty Acids (FISH OIL) 1200 MG CAPS, Take by mouth daily., Disp: , Rfl:  .  Potassium Gluconate 550 MG TABS, Take by mouth daily., Disp: , Rfl:   Allergies  Allergen Reactions  . Lisinopril     REACTION: angioedema    Objective:   There were no vitals taken for this visit.  AAOx3, NAD NCAT, EOMI No obvious CN deficits Coloring WNL Pt is able to speak clearly, coherently without shortness of breath or increased work of breathing.  Thought process is linear.  Mood is appropriate.   Assessment and Plan:   Suspected COVID- new.  Pt's sxs are consistent w/ viral illness.  Pt is improving w/ rest and hydration.  He will post test results to MyChart when available.  Reviewed supportive care and red flags that should prompt return.  Pt expressed understanding and is in agreement w/ plan.   Annye Asa, MD 01/15/2021

## 2021-01-15 NOTE — Progress Notes (Signed)
I connected with  Christian Henry on 01/15/21 by a video enabled telemedicine application and verified that I am speaking with the correct person using two identifiers.   I discussed the limitations of evaluation and management by telemedicine. The patient expressed understanding and agreed to proceed.

## 2021-01-16 ENCOUNTER — Encounter: Payer: Self-pay | Admitting: Family Medicine

## 2021-01-31 ENCOUNTER — Other Ambulatory Visit: Payer: Self-pay | Admitting: Family Medicine

## 2021-02-19 ENCOUNTER — Other Ambulatory Visit: Payer: Self-pay | Admitting: Family Medicine

## 2021-04-18 ENCOUNTER — Encounter: Payer: PRIVATE HEALTH INSURANCE | Admitting: Family Medicine

## 2021-04-26 ENCOUNTER — Other Ambulatory Visit: Payer: Self-pay

## 2021-04-26 ENCOUNTER — Ambulatory Visit (INDEPENDENT_AMBULATORY_CARE_PROVIDER_SITE_OTHER): Payer: 59 | Admitting: Family Medicine

## 2021-04-26 ENCOUNTER — Encounter: Payer: Self-pay | Admitting: Family Medicine

## 2021-04-26 VITALS — BP 138/80 | HR 72 | Temp 99.3°F | Resp 20 | Ht 72.5 in | Wt 277.8 lb

## 2021-04-26 DIAGNOSIS — Z125 Encounter for screening for malignant neoplasm of prostate: Secondary | ICD-10-CM | POA: Diagnosis not present

## 2021-04-26 DIAGNOSIS — E785 Hyperlipidemia, unspecified: Secondary | ICD-10-CM | POA: Diagnosis not present

## 2021-04-26 DIAGNOSIS — E1169 Type 2 diabetes mellitus with other specified complication: Secondary | ICD-10-CM | POA: Diagnosis not present

## 2021-04-26 DIAGNOSIS — Z0000 Encounter for general adult medical examination without abnormal findings: Secondary | ICD-10-CM | POA: Diagnosis not present

## 2021-04-26 DIAGNOSIS — E119 Type 2 diabetes mellitus without complications: Secondary | ICD-10-CM | POA: Diagnosis not present

## 2021-04-26 LAB — CBC WITH DIFFERENTIAL/PLATELET
Basophils Absolute: 0 10*3/uL (ref 0.0–0.1)
Basophils Relative: 0.5 % (ref 0.0–3.0)
Eosinophils Absolute: 0.3 10*3/uL (ref 0.0–0.7)
Eosinophils Relative: 4.9 % (ref 0.0–5.0)
HCT: 42.3 % (ref 39.0–52.0)
Hemoglobin: 14 g/dL (ref 13.0–17.0)
Lymphocytes Relative: 34 % (ref 12.0–46.0)
Lymphs Abs: 1.8 10*3/uL (ref 0.7–4.0)
MCHC: 33.2 g/dL (ref 30.0–36.0)
MCV: 83.3 fl (ref 78.0–100.0)
Monocytes Absolute: 0.5 10*3/uL (ref 0.1–1.0)
Monocytes Relative: 10.1 % (ref 3.0–12.0)
Neutro Abs: 2.7 10*3/uL (ref 1.4–7.7)
Neutrophils Relative %: 50.5 % (ref 43.0–77.0)
Platelets: 213 10*3/uL (ref 150.0–400.0)
RBC: 5.07 Mil/uL (ref 4.22–5.81)
RDW: 13.5 % (ref 11.5–15.5)
WBC: 5.3 10*3/uL (ref 4.0–10.5)

## 2021-04-26 LAB — BASIC METABOLIC PANEL
BUN: 11 mg/dL (ref 6–23)
CO2: 29 mEq/L (ref 19–32)
Calcium: 9.4 mg/dL (ref 8.4–10.5)
Chloride: 102 mEq/L (ref 96–112)
Creatinine, Ser: 1 mg/dL (ref 0.40–1.50)
GFR: 87.27 mL/min (ref >60.00)
Glucose, Bld: 92 mg/dL (ref 70–99)
Potassium: 4.2 mEq/L (ref 3.5–5.1)
Sodium: 140 mEq/L (ref 135–145)

## 2021-04-26 LAB — LIPID PANEL
Cholesterol: 143 mg/dL (ref 0–200)
HDL: 49.1 mg/dL (ref >39.00)
LDL Cholesterol: 80 mg/dL (ref 0–99)
NonHDL: 94.29
Total CHOL/HDL Ratio: 3
Triglycerides: 72 mg/dL (ref 0.0–149.0)
VLDL: 14.4 mg/dL (ref 0.0–40.0)

## 2021-04-26 LAB — HEPATIC FUNCTION PANEL
ALT: 34 U/L (ref 0–53)
AST: 21 U/L (ref 0–37)
Albumin: 4.4 g/dL (ref 3.5–5.2)
Alkaline Phosphatase: 59 U/L (ref 39–117)
Bilirubin, Direct: 0.2 mg/dL (ref 0.0–0.3)
Total Bilirubin: 0.8 mg/dL (ref 0.2–1.2)
Total Protein: 7.6 g/dL (ref 6.0–8.3)

## 2021-04-26 LAB — MICROALBUMIN / CREATININE URINE RATIO
Creatinine,U: 211.2 mg/dL
Microalb Creat Ratio: 0.5 mg/g (ref 0.0–30.0)
Microalb, Ur: 1.1 mg/dL (ref 0.0–1.9)

## 2021-04-26 LAB — HEMOGLOBIN A1C: Hgb A1c MFr Bld: 6.3 % (ref 4.6–6.5)

## 2021-04-26 LAB — TSH: TSH: 1.29 u[IU]/mL (ref 0.35–4.50)

## 2021-04-26 LAB — PSA: PSA: 0.55 ng/mL (ref 0.10–4.00)

## 2021-04-26 NOTE — Progress Notes (Incomplete)
   Subjective:    Patient ID: Christian Henry, male    DOB: 31-Mar-1971, 50 y.o.   MRN: [id number]  HPI CPE- UTD on colonoscopy, eye exam, Tdap, COVID.  Due for foot exam.  Reviewed past medical, surgical, family and social histories.   Health Maintenance  Topic Date Due  . FOOT EXAM  02/06/2021  . URINE MICROALBUMIN  02/06/2021  . HEMOGLOBIN A1C  04/11/2021  . PNEUMOCOCCAL POLYSACCHARIDE VACCINE AGE 65-64 HIGH RISK  10/11/2021 (Originally 01/06/1972)  . Hepatitis C Screening  10/11/2021 (Originally [birth year])  . HIV Screening  04/26/2022 (Originally 01/05/1985)  . OPHTHALMOLOGY EXAM  05/11/2021  . INFLUENZA VACCINE  07/30/2021  . COLONOSCOPY (Pts 45-47yrs Insurance coverage will need to be confirmed)  03/03/2023  . TETANUS/TDAP  05/09/2026  . COVID-19 Vaccine  Completed  . HPV VACCINES  Aged Out      Review of Systems Patient reports no vision/hearing changes, anorexia, fever ,adenopathy, persistant/recurrent hoarseness, swallowing issues, chest pain, palpitations, edema, persistant/recurrent cough, hemoptysis, dyspnea (rest,exertional, paroxysmal nocturnal), gastrointestinal  bleeding (melena, rectal bleeding), abdominal pain, excessive heart burn, GU symptoms (dysuria, hematuria, voiding/incontinence issues) syncope, focal weakness, memory loss, numbness & tingling, skin/hair/nail changes, depression, anxiety, abnormal bruising/bleeding, musculoskeletal symptoms/signs.  + weight gain  This visit occurred during the SARS-CoV-2 public health emergency.  Safety protocols were in place, including screening questions prior to the visit, additional usage of staff PPE, and extensive cleaning of exam room while observing appropriate contact time as indicated for disinfecting solutions.       Objective:   Physical Exam General Appearance:    Alert, cooperative, no distress, appears stated age  Head:    Normocephalic, without obvious abnormality, atraumatic  Eyes:    PERRL, conjunctiva/corneas  clear, EOM's intact, fundi    benign, both eyes       Ears:    Normal TM's and external ear canals, both ears  Nose:   Deferred due to COVID  Throat:   Neck:   Supple, symmetrical, trachea midline, no adenopathy;       thyroid:  No enlargement/tenderness/nodules  Back:     Symmetric, no curvature, ROM normal, no CVA tenderness  Lungs:     Clear to auscultation bilaterally, respirations unlabored  Chest wall:    No tenderness or deformity  Heart:    Regular rate and rhythm, S1 and S2 normal, no murmur, rub   or gallop  Abdomen:     Soft, non-tender, bowel sounds active all four quadrants,    no masses, no organomegaly  Genitalia:    Normal male without lesion, masses,discharge or tenderness  Rectal:    Deferred due to young age  Extremities:   Extremities normal, atraumatic, no cyanosis or edema  Pulses:   2+ and symmetric all extremities  Skin:   Skin color, texture, turgor normal, no rashes or lesions  Lymph nodes:   Cervical, supraclavicular, and axillary nodes normal  Neurologic:   CNII-XII intact. Normal strength, sensation and reflexes      throughout         Assessment & Plan:

## 2021-04-26 NOTE — Patient Instructions (Addendum)
Follow up in 6 months to recheck diabetes, BP, cholesterol We'll notify you of your lab results and make any changes if needed Continue to work on healthy diet and regular exercise- you can do it! Schedule your eye exam- due after 5/13 Call with any questions or concerns GOOD LUCK WITH GRADUATION!!!

## 2021-04-27 NOTE — Assessment & Plan Note (Signed)
PE WNL w/ exception of obesity.  UTD on colonoscopy, immunizations.  Foot exam done today.  Check labs.  Anticipatory guidance provided.

## 2021-04-27 NOTE — Assessment & Plan Note (Signed)
Ongoing issue for pt.  Stressed need for low carb diet and regular exercise.  UTD on eye exam- due next month.  UTD on microalbumin.  Foot exam done today.  Check labs.

## 2021-04-27 NOTE — Progress Notes (Signed)
   Subjective:    Patient ID: Christian Henry, male    DOB: [birth year], 50 y.o.   MRN: [id number]  HPI CPE- UTD on colonoscopy, Tdap, COVID, eye exam.  Due for foot exam.  Reviewed past medical, surgical, family and social histories.   Health Maintenance  Topic Date Due  . PNEUMOCOCCAL POLYSACCHARIDE VACCINE AGE 103-64 HIGH RISK  10/11/2021 (Originally 01/06/1972)  . Hepatitis C Screening  10/11/2021 (Originally 02-Apr-1971)  . HIV Screening  04/26/2022 (Originally 01/05/1985)  . OPHTHALMOLOGY EXAM  05/11/2021  . INFLUENZA VACCINE  07/30/2021  . HEMOGLOBIN A1C  10/26/2021  . FOOT EXAM  04/26/2022  . URINE MICROALBUMIN  04/26/2022  . COLONOSCOPY (Pts 45-70yrs Insurance coverage will need to be confirmed)  03/03/2023  . TETANUS/TDAP  05/09/2026  . COVID-19 Vaccine  Completed  . HPV VACCINES  Aged Out     Review of Systems Patient reports no vision/hearing changes, anorexia, fever ,adenopathy, persistant/recurrent hoarseness, swallowing issues, chest pain, palpitations, edema, persistant/recurrent cough, hemoptysis, dyspnea (rest,exertional, paroxysmal nocturnal), gastrointestinal  bleeding (melena, rectal bleeding), abdominal pain, excessive heart burn, GU symptoms (dysuria, hematuria, voiding/incontinence issues) syncope, focal weakness, memory loss, numbness & tingling, skin/hair/nail changes, depression, anxiety, abnormal bruising/bleeding, musculoskeletal symptoms/signs.   This visit occurred during the SARS-CoV-2 public health emergency.  Safety protocols were in place, including screening questions prior to the visit, additional usage of staff PPE, and extensive cleaning of exam room while observing appropriate contact time as indicated for disinfecting solutions.       Objective:   Physical Exam General Appearance:    Alert, cooperative, no distress, appears stated age  Head:    Normocephalic, without obvious abnormality, atraumatic  Eyes:    PERRL, conjunctiva/corneas clear, EOM's intact,  fundi    benign, both eyes       Ears:    Normal TM's and external ear canals, both ears  Nose:   Deferred due to COVID  Throat:   Neck:   Supple, symmetrical, trachea midline, no adenopathy;       thyroid:  No enlargement/tenderness/nodules  Back:     Symmetric, no curvature, ROM normal, no CVA tenderness  Lungs:     Clear to auscultation bilaterally, respirations unlabored  Chest wall:    No tenderness or deformity  Heart:    Regular rate and rhythm, S1 and S2 normal, no murmur, rub   or gallop  Abdomen:     Soft, non-tender, bowel sounds active all four quadrants,    no masses, no organomegaly  Genitalia:      Rectal:    Extremities:   Extremities normal, atraumatic, no cyanosis or edema  Pulses:   2+ and symmetric all extremities  Skin:   Skin color, texture, turgor normal, no rashes or lesions  Lymph nodes:   Cervical, supraclavicular, and axillary nodes normal  Neurologic:   CNII-XII intact. Normal strength, sensation and reflexes      throughout          Assessment & Plan:

## 2021-04-27 NOTE — Assessment & Plan Note (Signed)
Deteriorated.  Pt has gained weight since last visit and BMI is now 37.16  Given his other medical issues, this qualifies as morbidly obese.  Stressed need for healthy diet and regular exercise.  Will follow.

## 2021-04-27 NOTE — Assessment & Plan Note (Signed)
Chronic problem.  Check labs and adjust meds prn. 

## 2021-06-25 ENCOUNTER — Other Ambulatory Visit: Payer: Self-pay | Admitting: Family Medicine

## 2021-06-25 NOTE — Telephone Encounter (Signed)
Patient has refills on file

## 2021-06-27 ENCOUNTER — Encounter: Payer: Self-pay | Admitting: *Deleted

## 2021-08-17 ENCOUNTER — Other Ambulatory Visit: Payer: Self-pay | Admitting: Family Medicine

## 2021-08-18 ENCOUNTER — Other Ambulatory Visit: Payer: Self-pay | Admitting: Family Medicine

## 2021-08-21 ENCOUNTER — Other Ambulatory Visit: Payer: Self-pay | Admitting: Family Medicine

## 2021-09-27 ENCOUNTER — Encounter: Payer: Self-pay | Admitting: Family Medicine

## 2021-09-27 ENCOUNTER — Telehealth (INDEPENDENT_AMBULATORY_CARE_PROVIDER_SITE_OTHER): Payer: 59 | Admitting: Family Medicine

## 2021-09-27 VITALS — Ht 73.0 in | Wt 240.0 lb

## 2021-09-27 DIAGNOSIS — L259 Unspecified contact dermatitis, unspecified cause: Secondary | ICD-10-CM

## 2021-09-27 MED ORDER — PREDNISONE 10 MG PO TABS: ORAL_TABLET | ORAL | 0 refills | Status: DC

## 2021-09-27 MED ORDER — TRIAMCINOLONE ACETONIDE 0.1 % EX OINT: 1.0000 "application " | TOPICAL_OINTMENT | Freq: Two times a day (BID) | CUTANEOUS | 1 refills | Status: DC

## 2021-09-27 NOTE — Progress Notes (Signed)
Virtual Visit via Video   I connected with patient on 09/27/21 at  1:30 PM EDT by a video enabled telemedicine application and verified that I am speaking with the correct person using two identifiers.  Location patient: Home Location provider: [facility], Office Persons participating in the virtual visit: Patient, Provider, [place] Claiborne Billings C)  I discussed the limitations of evaluation and management by telemedicine and the availability of in person appointments. The patient expressed understanding and agreed to proceed.  Subjective:   HPI:   Bites- pt borrowed a ladder that was stored outdoors and after carrying ladder back and forth across the street he noticed what he is assuming are ant bites on both arms the next morning.  Was itching- applied alcohol.  But it got worse.  Sxs temporarily improve w/ calamine lotion but itching returns as med wears off.  Using Zyrtec and Benadryl.  In his sleep, pt is scratching to point of excoriation.  Has spreading red areas on both arms  ROS:   See pertinent positives and negatives per HPI.  Patient Active Problem List   Diagnosis Date Noted   Leg cramps 11/17/2019   Morbid obesity ([place]) 05/05/2019   OSA (obstructive sleep apnea) 04/22/2019   Diet-controlled diabetes mellitus ([place]) 07/11/2015   Sciatica of left side 03/04/2014   URI (upper respiratory infection) 12/29/2012   Uvular hypertrophy 07/24/2012   Metatarsalgia 10/20/2011   General medical examination 10/18/2011   Hyperlipidemia associated with type 2 diabetes mellitus ([place]) 09/14/2010   SWELLING MASS OR LUMP IN HEAD AND NECK 06/19/2010   BACK PAIN 06/21/2009   RHINITIS 04/12/2009   Essential hypertension 02/11/2007    Social History   Tobacco Use   Smoking status: Never   Smokeless tobacco: Never  Substance Use Topics   Alcohol use: Yes    Alcohol/week: 4.0 standard drinks    Types: 4 Cans of beer per week    Current Outpatient Medications:    amLODipine  (NORVASC) 10 MG tablet, TAKE 1 TABLET BY MOUTH EVERY DAY, Disp: 90 tablet, Rfl: 1   atorvastatin (LIPITOR) 20 MG tablet, TAKE 1 TABLET BY MOUTH EVERY DAY, Disp: 30 tablet, Rfl: 5   carvedilol (COREG) 6.25 MG tablet, TAKE 1 TABLET BY MOUTH 2 TIMES DAILY WITH A MEAL., Disp: 180 tablet, Rfl: 1   COVID-19 mRNA vaccine, Pfizer, 30 MCG/0.3ML injection, INJECT AS DIRECTED, Disp: .3 mL, Rfl: 0   fluticasone (FLONASE) 50 MCG/ACT nasal spray, Place 2 sprays into both nostrils daily., Disp: 16 g, Rfl: 6   MAGNESIUM PO, Take by mouth., Disp: , Rfl:    Multiple Vitamins-Minerals (PRESERVISION AREDS 2) CAPS, Take by mouth., Disp: , Rfl:    Omega-3 Fatty Acids (FISH OIL) 1200 MG CAPS, Take by mouth daily., Disp: , Rfl:    Potassium Gluconate 550 MG TABS, Take by mouth daily., Disp: , Rfl:   Allergies  Allergen Reactions   Lisinopril     REACTION: angioedema    Objective:   Ht 6\' 1"  (1.854 m)   Wt 240 lb (108.9 kg) Comment: pt reported  BMI 31.66 kg/m   AAOx3, NAD NCAT, EOMI No obvious CN deficits Pt is able to speak clearly, coherently without shortness of breath or increased work of breathing.  Unable to see arms due to video quality Thought process is linear.  Mood is appropriate.   Assessment and Plan:   Contact dermatitis- new.  Pt carried a ladder under both arms that was stored outside so there is  no telling what was on it and came in contact w/ his skin.  Given that he is itching to the point of excoriation and the redness is spreading, will start Prednisone taper and topical triamcinolone.  Pt expressed understanding and is in agreement w/ plan.    Annye Asa, MD 09/27/2021

## 2021-10-26 ENCOUNTER — Ambulatory Visit: Payer: 59 | Admitting: Family Medicine

## 2022-01-01 ENCOUNTER — Telehealth (INDEPENDENT_AMBULATORY_CARE_PROVIDER_SITE_OTHER): Payer: PRIVATE HEALTH INSURANCE | Admitting: Registered Nurse

## 2022-01-01 DIAGNOSIS — U071 COVID-19: Secondary | ICD-10-CM | POA: Diagnosis not present

## 2022-01-01 DIAGNOSIS — R0981 Nasal congestion: Secondary | ICD-10-CM

## 2022-01-01 MED ORDER — PREDNISONE 10 MG (21) PO TBPK: ORAL_TABLET | ORAL | 0 refills | Status: DC

## 2022-01-01 MED ORDER — AZELASTINE HCL 0.1 % NA SOLN: 1.0000 | Freq: Two times a day (BID) | NASAL | 12 refills | Status: AC

## 2022-01-01 NOTE — Progress Notes (Signed)
Telemedicine Encounter- SOAP NOTE Established Patient  This telephone encounter was conducted with the patient's (or proxy's) verbal consent via audio telecommunications: yes/no: Yes Patient was instructed to have this encounter in a suitably private space; and to only have persons present to whom they give permission to participate. In addition, patient identity was confirmed by use of name plus two identifiers (DOB and address).  I discussed the limitations, risks, security and privacy concerns of performing an evaluation and management service by telephone and the availability of in person appointments. I also discussed with the patient that there may be a patient responsible charge related to this service. The patient expressed understanding and agreed to proceed.  I spent a total of 14 minutes talking with the patient or their proxy.  Patient at home Provider in office  Participants: Kathrin Ruddy, NP and Lou Miner  Chief Complaint  Patient presents with   Covid Positive    Positive on 12/28/21, cough, sinus drainage, denies fever and related, no other sxs noted     Subjective   Christian Henry is a 51 y.o. established patient. Telephone visit today for COVID+  HPI Symptoms onset last Wednesday Tested Thursday, came back positive. Had cough, sinus drainage. Some fatigue.  No fever, chills, sweats, nvd.   Notes symptoms have improved, still having some head congestion.   Has been home from work  Patient Active Problem List   Diagnosis Date Noted   Leg cramps 11/17/2019   Morbid obesity ([place]) 05/05/2019   OSA (obstructive sleep apnea) 04/22/2019   Diet-controlled diabetes mellitus ([place]) 07/11/2015   Sciatica of left side 03/04/2014   URI (upper respiratory infection) 12/29/2012   Uvular hypertrophy 07/24/2012   Metatarsalgia 10/20/2011   General medical examination 10/18/2011   Hyperlipidemia associated with type 2 diabetes mellitus ([place]) 09/14/2010   SWELLING  MASS OR LUMP IN HEAD AND NECK 06/19/2010   BACK PAIN 06/21/2009   RHINITIS 04/12/2009   Essential hypertension 02/11/2007    Past Medical History:  Diagnosis Date   Diabetes mellitus without complication (HCC)    pre-diabetic-no meds   Hyperlipidemia    Hypertension    Macular degeneration    Sleep apnea    uses CPAP     Current Outpatient Medications  Medication Sig Dispense Refill   amLODipine (NORVASC) 10 MG tablet TAKE 1 TABLET BY MOUTH EVERY DAY 90 tablet 1   atorvastatin (LIPITOR) 20 MG tablet TAKE 1 TABLET BY MOUTH EVERY DAY 30 tablet 5   azelastine (ASTELIN) 0.1 % nasal spray Place 1 spray into both nostrils 2 (two) times daily. Use in each nostril as directed 30 mL 12   carvedilol (COREG) 6.25 MG tablet TAKE 1 TABLET BY MOUTH 2 TIMES DAILY WITH A MEAL. 180 tablet 1   fluticasone (FLONASE) 50 MCG/ACT nasal spray Place 2 sprays into both nostrils daily. 16 g 6   MAGNESIUM PO Take by mouth.     Multiple Vitamins-Minerals (PRESERVISION AREDS 2) CAPS Take by mouth.     Omega-3 Fatty Acids (FISH OIL) 1200 MG CAPS Take by mouth daily.     Potassium Gluconate 550 MG TABS Take by mouth daily.     predniSONE (STERAPRED UNI-PAK 21 TAB) 10 MG (21) TBPK tablet Take per package instructions. Do not skip doses. Finish entire supply. 1 each 0   triamcinolone ointment (KENALOG) 0.1 % Apply 1 application topically 2 (two) times daily. 90 g 1   No current facility-administered medications for this visit.  Allergies  Allergen Reactions   Lisinopril     REACTION: angioedema    Social History   Socioeconomic History   Marital status: Married    Spouse name: Not on file   Number of children: Not on file   Years of education: Not on file   Highest education level: Not on file  Occupational History   Not on file  Tobacco Use   Smoking status: Never   Smokeless tobacco: Never  Vaping Use   Vaping Use: Never used  Substance and Sexual Activity   Alcohol use: Yes     Alcohol/week: 4.0 standard drinks    Types: 4 Cans of beer per week   Drug use: No   Sexual activity: Not on file  Other Topics Concern   Not on file  Social History Narrative   Married, 2 kids.   Social Determinants of Health   Financial Resource Strain: Not on file  Food Insecurity: Not on file  Transportation Needs: Not on file  Physical Activity: Not on file  Stress: Not on file  Social Connections: Not on file  Intimate Partner Violence: Not on file    Review of Systems  Constitutional: Negative.   HENT:  Positive for congestion. Negative for ear discharge, ear pain, hearing loss, nosebleeds, sinus pain, sore throat and tinnitus.   Eyes: Negative.   Respiratory: Negative.  Negative for stridor.   Cardiovascular: Negative.   Gastrointestinal: Negative.   Genitourinary: Negative.   Musculoskeletal: Negative.   Skin: Negative.   Neurological: Negative.   Endo/Heme/Allergies: Negative.   Psychiatric/Behavioral: Negative.    All other systems reviewed and are negative.  Objective   Vitals as reported by the patient: There were no vitals filed for this visit.  Raffael was seen today for covid positive.  Diagnoses and all orders for this visit:  Nasal congestion -     azelastine (ASTELIN) 0.1 % nasal spray; Place 1 spray into both nostrils 2 (two) times daily. Use in each nostril as directed -     predniSONE (STERAPRED UNI-PAK 21 TAB) 10 MG (21) TBPK tablet; Take per package instructions. Do not skip doses. Finish entire supply.  COVID-19 -     predniSONE (STERAPRED UNI-PAK 21 TAB) 10 MG (21) TBPK tablet; Take per package instructions. Do not skip doses. Finish entire supply.    PLAN Prednisone taper and azelastine for lingering symptoms. Return if worsening or failing to improve Reviewed isolation guidelines with patient who voices understanding. Patient encouraged to call clinic with any questions, comments, or concerns.  I discussed the assessment and  treatment plan with the patient. The patient was provided an opportunity to ask questions and all were answered. The patient agreed with the plan and demonstrated an understanding of the instructions.   The patient was advised to call back or seek an in-person evaluation if the symptoms worsen or if the condition fails to improve as anticipated.  I provided 13 minutes of non-face-to-face time during this encounter.  Maximiano Coss, NP

## 2022-02-05 ENCOUNTER — Encounter: Payer: Self-pay | Admitting: Family Medicine

## 2022-02-05 ENCOUNTER — Ambulatory Visit: Payer: PRIVATE HEALTH INSURANCE | Admitting: Family Medicine

## 2022-02-05 VITALS — BP 128/84 | HR 66 | Temp 97.5°F | Resp 16 | Wt 283.4 lb

## 2022-02-05 DIAGNOSIS — I10 Essential (primary) hypertension: Secondary | ICD-10-CM

## 2022-02-05 DIAGNOSIS — M25561 Pain in right knee: Secondary | ICD-10-CM

## 2022-02-05 DIAGNOSIS — E785 Hyperlipidemia, unspecified: Secondary | ICD-10-CM

## 2022-02-05 DIAGNOSIS — E119 Type 2 diabetes mellitus without complications: Secondary | ICD-10-CM | POA: Diagnosis not present

## 2022-02-05 DIAGNOSIS — E1169 Type 2 diabetes mellitus with other specified complication: Secondary | ICD-10-CM

## 2022-02-05 LAB — CBC WITH DIFFERENTIAL/PLATELET
Basophils Absolute: 0 10*3/uL (ref 0.0–0.1)
Basophils Relative: 0.6 % (ref 0.0–3.0)
Eosinophils Absolute: 0.2 10*3/uL (ref 0.0–0.7)
Eosinophils Relative: 4.5 % (ref 0.0–5.0)
HCT: 40.7 % (ref 39.0–52.0)
Hemoglobin: 13.3 g/dL (ref 13.0–17.0)
Lymphocytes Relative: 40.9 % (ref 12.0–46.0)
Lymphs Abs: 1.8 10*3/uL (ref 0.7–4.0)
MCHC: 32.8 g/dL (ref 30.0–36.0)
MCV: 82.8 fl (ref 78.0–100.0)
Monocytes Absolute: 0.5 10*3/uL (ref 0.1–1.0)
Monocytes Relative: 10.5 % (ref 3.0–12.0)
Neutro Abs: 1.9 10*3/uL (ref 1.4–7.7)
Neutrophils Relative %: 43.5 % (ref 43.0–77.0)
Platelets: 199 10*3/uL (ref 150.0–400.0)
RBC: 4.92 Mil/uL (ref 4.22–5.81)
RDW: 13 % (ref 11.5–15.5)
WBC: 4.3 10*3/uL (ref 4.0–10.5)

## 2022-02-05 LAB — BASIC METABOLIC PANEL
BUN: 12 mg/dL (ref 6–23)
CO2: 29 mEq/L (ref 19–32)
Calcium: 9.1 mg/dL (ref 8.4–10.5)
Chloride: 103 mEq/L (ref 96–112)
Creatinine, Ser: 1.02 mg/dL (ref 0.40–1.50)
GFR: 84.75 mL/min (ref >60.00)
Glucose, Bld: 110 mg/dL — ABNORMAL HIGH (ref 70–99)
Potassium: 3.8 mEq/L (ref 3.5–5.1)
Sodium: 137 mEq/L (ref 135–145)

## 2022-02-05 LAB — LIPID PANEL
Cholesterol: 128 mg/dL (ref 0–200)
HDL: 37.3 mg/dL — ABNORMAL LOW (ref >39.00)
LDL Cholesterol: 75 mg/dL (ref 0–99)
NonHDL: 90.51
Total CHOL/HDL Ratio: 3
Triglycerides: 76 mg/dL (ref 0.0–149.0)
VLDL: 15.2 mg/dL (ref 0.0–40.0)

## 2022-02-05 LAB — HEPATIC FUNCTION PANEL
ALT: 21 U/L (ref 0–53)
AST: 19 U/L (ref 0–37)
Albumin: 4.2 g/dL (ref 3.5–5.2)
Alkaline Phosphatase: 58 U/L (ref 39–117)
Bilirubin, Direct: 0.1 mg/dL (ref 0.0–0.3)
Total Bilirubin: 0.8 mg/dL (ref 0.2–1.2)
Total Protein: 7 g/dL (ref 6.0–8.3)

## 2022-02-05 LAB — TSH: TSH: 2.74 u[IU]/mL (ref 0.35–5.50)

## 2022-02-05 LAB — URIC ACID: Uric Acid, Serum: 4.5 mg/dL (ref 4.0–7.8)

## 2022-02-05 LAB — HEMOGLOBIN A1C: Hgb A1c MFr Bld: 6.7 % — ABNORMAL HIGH (ref 4.6–6.5)

## 2022-02-05 NOTE — Assessment & Plan Note (Signed)
Deteriorated.  Pt has gained 6 lbs since last visit.  BMI now 37.39 and coupled w/ HTN, hyperlipidemia, and DM this qualifies as morbidly obese.  Encouraged healthy diet and regular exercise.  Will follow.

## 2022-02-05 NOTE — Assessment & Plan Note (Signed)
Chronic problem.  Overdue for A1C.  Due for eye exam.  UTD on foot exam and microalbumin.  Check labs and start meds prn.

## 2022-02-05 NOTE — Assessment & Plan Note (Signed)
Chronic problem.  On Lipitor w/o difficulty.  Check labs.  Adjust meds prn

## 2022-02-05 NOTE — Progress Notes (Signed)
° °  Subjective:    Patient ID: Christian Henry, male    DOB: Sep 02, 1971, 51 y.o.   MRN: [id number]  HPI HTN- chronic problem, on Amlodipine 10mg  daily, Coreg 6.25mg  BID.  Denies CP, SOB, HAs, visual changes, edema.  Hyperlipidemia- chronic problem, on Lipitor 20mg .  Denies abd pain, N/V.  DM- ongoing issue.  Currently diet controlled but pt has gained 6 lbs.  Due for eye exam.  UTD on foot exam, UTD on microalbumin.  No numbness/tingling of hands/feet.  Knee pain- R anterior knee.  'i could barely walk for 2-3 weeks'.  Knee was swollen, was 'slightly' warm to the touch.  Pt is worried about gout b/c dad had it.  No pain currently.     Review of Systems For ROS see HPI   This visit occurred during the SARS-CoV-2 public health emergency.  Safety protocols were in place, including screening questions prior to the visit, additional usage of staff PPE, and extensive cleaning of exam room while observing appropriate contact time as indicated for disinfecting solutions.      Objective:   Physical Exam Vitals reviewed.  Constitutional:      General: He is not in acute distress.    Appearance: Normal appearance. He is well-developed. He is obese. He is not ill-appearing.  HENT:     Head: Normocephalic and atraumatic.  Eyes:     Extraocular Movements: Extraocular movements intact.     Conjunctiva/sclera: Conjunctivae normal.     Pupils: Pupils are equal, round, and reactive to light.  Neck:     Thyroid: No thyromegaly.  Cardiovascular:     Rate and Rhythm: Normal rate and regular rhythm.     Pulses: Normal pulses.     Heart sounds: Normal heart sounds. No murmur heard. Pulmonary:     Effort: Pulmonary effort is normal. No respiratory distress.     Breath sounds: Normal breath sounds.  Abdominal:     General: Bowel sounds are normal. There is no distension.     Palpations: Abdomen is soft.  Musculoskeletal:     Cervical back: Normal range of motion and neck supple.     Right lower leg:  No edema.     Left lower leg: No edema.  Lymphadenopathy:     Cervical: No cervical adenopathy.  Skin:    General: Skin is warm and dry.  Neurological:     General: No focal deficit present.     Mental Status: He is alert and oriented to person, place, and time.     Cranial Nerves: No cranial nerve deficit.  Psychiatric:        Mood and Affect: Mood normal.        Behavior: Behavior normal.          Assessment & Plan:  R knee pain- new.  Pt is asymptomatic today but reports his knee was painful, warm, and swollen which made him concerned for gout (family hx).  Check uric acid and tx prn.

## 2022-02-05 NOTE — Assessment & Plan Note (Signed)
Chronic problem.  Adequate control on Amlodipine and Coreg.  Currently asymptomatic.  No med changes at this time

## 2022-02-05 NOTE — Patient Instructions (Signed)
Schedule your complete physical for 3-4 months We'll notify you of your lab results and make any changes if needed Continue to work on healthy diet and regular exercise- you can do it! Call with any questions or concerns Stay Safe!  Stay Healthy!

## 2022-02-06 ENCOUNTER — Telehealth: Payer: Self-pay

## 2022-02-06 NOTE — Telephone Encounter (Signed)
Pt is aware of the lab results

## 2022-02-06 NOTE — Telephone Encounter (Signed)
Lvm for patient to return my call about labs

## 2022-02-06 NOTE — Telephone Encounter (Signed)
-----   Message from Midge Minium, MD sent at 02/06/2022  7:16 AM EST ----- A1C is up somewhat to 6.7%  This will improve w/ low carb diet and regular exercise.  No med changes at this time.  Remainder of labs look great and uric acid is normal.  This is great news!

## 2022-02-18 ENCOUNTER — Other Ambulatory Visit: Payer: Self-pay | Admitting: Family Medicine

## 2022-06-21 ENCOUNTER — Encounter: Payer: PRIVATE HEALTH INSURANCE | Admitting: Family Medicine

## 2022-06-28 ENCOUNTER — Encounter: Payer: Self-pay | Admitting: Family Medicine

## 2022-06-28 ENCOUNTER — Ambulatory Visit (INDEPENDENT_AMBULATORY_CARE_PROVIDER_SITE_OTHER): Payer: PRIVATE HEALTH INSURANCE | Admitting: Family Medicine

## 2022-06-28 VITALS — BP 130/80 | HR 62 | Temp 98.1°F | Resp 16 | Ht 71.0 in | Wt 274.2 lb

## 2022-06-28 DIAGNOSIS — Z125 Encounter for screening for malignant neoplasm of prostate: Secondary | ICD-10-CM | POA: Diagnosis not present

## 2022-06-28 DIAGNOSIS — Z0000 Encounter for general adult medical examination without abnormal findings: Secondary | ICD-10-CM

## 2022-06-28 DIAGNOSIS — E119 Type 2 diabetes mellitus without complications: Secondary | ICD-10-CM | POA: Diagnosis not present

## 2022-06-28 LAB — BASIC METABOLIC PANEL
BUN: 15 mg/dL (ref 6–23)
CO2: 28 mEq/L (ref 19–32)
Calcium: 9.4 mg/dL (ref 8.4–10.5)
Chloride: 106 mEq/L (ref 96–112)
Creatinine, Ser: 0.99 mg/dL (ref 0.40–1.50)
GFR: 87.6 mL/min (ref >60.00)
Glucose, Bld: 116 mg/dL — ABNORMAL HIGH (ref 70–99)
Potassium: 3.8 mEq/L (ref 3.5–5.1)
Sodium: 140 mEq/L (ref 135–145)

## 2022-06-28 LAB — LIPID PANEL
Cholesterol: 131 mg/dL (ref 0–200)
HDL: 39.9 mg/dL (ref >39.00)
LDL Cholesterol: 79 mg/dL (ref 0–99)
NonHDL: 91.33
Total CHOL/HDL Ratio: 3
Triglycerides: 64 mg/dL (ref 0.0–149.0)
VLDL: 12.8 mg/dL (ref 0.0–40.0)

## 2022-06-28 LAB — HEPATIC FUNCTION PANEL
ALT: 25 U/L (ref 0–53)
AST: 20 U/L (ref 0–37)
Albumin: 4.4 g/dL (ref 3.5–5.2)
Alkaline Phosphatase: 57 U/L (ref 39–117)
Bilirubin, Direct: 0.1 mg/dL (ref 0.0–0.3)
Total Bilirubin: 0.6 mg/dL (ref 0.2–1.2)
Total Protein: 7.5 g/dL (ref 6.0–8.3)

## 2022-06-28 LAB — CBC WITH DIFFERENTIAL/PLATELET
Basophils Absolute: 0 10*3/uL (ref 0.0–0.1)
Basophils Relative: 0.6 % (ref 0.0–3.0)
Eosinophils Absolute: 0.2 10*3/uL (ref 0.0–0.7)
Eosinophils Relative: 3.4 % (ref 0.0–5.0)
HCT: 40.3 % (ref 39.0–52.0)
Hemoglobin: 13.2 g/dL (ref 13.0–17.0)
Lymphocytes Relative: 45.1 % (ref 12.0–46.0)
Lymphs Abs: 2.2 10*3/uL (ref 0.7–4.0)
MCHC: 32.7 g/dL (ref 30.0–36.0)
MCV: 84 fl (ref 78.0–100.0)
Monocytes Absolute: 0.4 10*3/uL (ref 0.1–1.0)
Monocytes Relative: 9 % (ref 3.0–12.0)
Neutro Abs: 2.1 10*3/uL (ref 1.4–7.7)
Neutrophils Relative %: 41.9 % — ABNORMAL LOW (ref 43.0–77.0)
Platelets: 202 10*3/uL (ref 150.0–400.0)
RBC: 4.8 Mil/uL (ref 4.22–5.81)
RDW: 13.3 % (ref 11.5–15.5)
WBC: 5 10*3/uL (ref 4.0–10.5)

## 2022-06-28 LAB — MICROALBUMIN / CREATININE URINE RATIO
Creatinine,U: 121.1 mg/dL
Microalb Creat Ratio: 0.8 mg/g (ref 0.0–30.0)
Microalb, Ur: 1 mg/dL (ref 0.0–1.9)

## 2022-06-28 LAB — TSH: TSH: 2.92 u[IU]/mL (ref 0.35–5.50)

## 2022-06-28 LAB — PSA: PSA: 0.7 ng/mL (ref 0.10–4.00)

## 2022-06-28 LAB — HEMOGLOBIN A1C: Hgb A1c MFr Bld: 6.5 % (ref 4.6–6.5)

## 2022-06-28 NOTE — Assessment & Plan Note (Signed)
Pt's PE WNL w/ exception of BMI.  UTD on Tdap, colonoscopy.  Eye exam scheduled for next week.  Foot exam done today.  Microalbumin ordered.  Check labs.  Anticipatory guidance provided.

## 2022-06-28 NOTE — Assessment & Plan Note (Signed)
Pt is down 9 lbs since last visit!  Applauded his efforts.  BMI now 38.25.  Still qualifies as morbidly obese due to coexisting medical conditions.  Will follow.

## 2022-06-28 NOTE — Patient Instructions (Signed)
Follow up in 6 months to recheck BP, cholesterol, sugar We'll notify you of your lab results and make any changes if needed Continue to work on healthy diet and regular exercise- you can do it! Have your eye doctor send me a copy of their report Call with any questions or concerns Stay Safe!  Stay Healthy! Have a great summer!!!

## 2022-06-28 NOTE — Progress Notes (Signed)
   Subjective:    Patient ID: Christian Henry, male    DOB: 13-Sep-1971, 51 y.o.   MRN: [id number]  HPI CPE- eye exam scheduled for next week.  Due for microalbumin.  UTD on colonoscopy, Tdap.  Due for foot exam.  Health Maintenance  Topic Date Due   OPHTHALMOLOGY EXAM  05/11/2021   URINE MICROALBUMIN  04/26/2022   Zoster Vaccines- Shingrix (1 of 2) 09/28/2022 (Originally 01/06/2020)   INFLUENZA VACCINE  07/30/2022   HEMOGLOBIN A1C  08/05/2022   COLONOSCOPY (Pts 45-14yr Insurance coverage will need to be confirmed)  03/03/2023   FOOT EXAM  06/29/2023   TETANUS/TDAP  05/09/2026   HPV VACCINES  Aged Out   COVID-19 Vaccine  Discontinued   Hepatitis C Screening  Discontinued   HIV Screening  Discontinued      Review of Systems Patient reports no vision/hearing changes, anorexia, fever ,adenopathy, persistant/recurrent hoarseness, swallowing issues, chest pain, palpitations, edema, persistant/recurrent cough, hemoptysis, dyspnea (rest,exertional, paroxysmal nocturnal), gastrointestinal  bleeding (melena, rectal bleeding), abdominal pain, excessive heart burn, GU symptoms (dysuria, hematuria, voiding/incontinence issues) syncope, focal weakness, memory loss, numbness & tingling, skin/hair/nail changes, depression, anxiety, abnormal bruising/bleeding, musculoskeletal symptoms/signs.   + 9 lb weight loss    Objective:   Physical Exam General Appearance:    Alert, cooperative, no distress, appears stated age, obese  Head:    Normocephalic, without obvious abnormality, atraumatic  Eyes:    PERRL, conjunctiva/corneas clear, EOM's intact both eyes       Ears:    Normal TM's and external ear canals, both ears  Nose:   Nares normal, septum midline, mucosa normal, no drainage   or sinus tenderness  Throat:   Lips, mucosa, and tongue normal; teeth and gums normal  Neck:   Supple, symmetrical, trachea midline, no adenopathy;       thyroid:  No enlargement/tenderness/nodules  Back:     Symmetric, no  curvature, ROM normal, no CVA tenderness  Lungs:     Clear to auscultation bilaterally, respirations unlabored  Chest wall:    No tenderness or deformity  Heart:    Regular rate and rhythm, S1 and S2 normal, no murmur, rub   or gallop  Abdomen:     Soft, non-tender, bowel sounds active all four quadrants,    no masses, no organomegaly  Genitalia:    deferred  Rectal:    Extremities:   Extremities normal, atraumatic, no cyanosis or edema  Pulses:   2+ and symmetric all extremities  Skin:   Skin color, texture, turgor normal, no rashes or lesions  Lymph nodes:   Cervical, supraclavicular, and axillary nodes normal  Neurologic:   CNII-XII intact. Normal strength, sensation and reflexes      throughout          Assessment & Plan:

## 2022-06-28 NOTE — Assessment & Plan Note (Signed)
Eye exam scheduled, foot exam done today.  Microalbumin ordered.  Check labs.  Start meds prn.

## 2022-07-03 ENCOUNTER — Telehealth: Payer: Self-pay

## 2022-07-03 NOTE — Telephone Encounter (Signed)
-----   Message from Midge Minium, MD sent at 07/02/2022  5:17 PM EDT ----- Labs look great!  No changes at this time

## 2022-07-03 NOTE — Telephone Encounter (Signed)
Left pt a detailed Vm in regards to lab results

## 2022-07-09 LAB — HM DIABETES EYE EXAM

## 2022-07-12 ENCOUNTER — Encounter: Payer: Self-pay | Admitting: Family Medicine

## 2022-07-22 ENCOUNTER — Encounter: Payer: Self-pay | Admitting: Family Medicine

## 2022-08-26 ENCOUNTER — Other Ambulatory Visit: Payer: Self-pay | Admitting: Family Medicine

## 2023-02-11 ENCOUNTER — Encounter: Payer: Self-pay | Admitting: Family Medicine

## 2023-02-11 ENCOUNTER — Ambulatory Visit (HOSPITAL_BASED_OUTPATIENT_CLINIC_OR_DEPARTMENT_OTHER)
Admission: RE | Admit: 2023-02-11 | Discharge: 2023-02-11 | Disposition: A | Discharge status: Home / Self Care | Payer: 59 | Source: Ambulatory Visit | Attending: Family Medicine | Admitting: Family Medicine

## 2023-02-11 ENCOUNTER — Ambulatory Visit: Payer: PRIVATE HEALTH INSURANCE | Admitting: Family Medicine

## 2023-02-11 VITALS — BP 134/86 | HR 65 | Temp 98.0°F | Resp 18 | Ht 71.0 in | Wt 288.1 lb

## 2023-02-11 DIAGNOSIS — M79645 Pain in left finger(s): Secondary | ICD-10-CM | POA: Diagnosis present

## 2023-02-11 DIAGNOSIS — M25531 Pain in right wrist: Secondary | ICD-10-CM | POA: Insufficient documentation

## 2023-02-11 DIAGNOSIS — M62830 Muscle spasm of back: Secondary | ICD-10-CM | POA: Diagnosis not present

## 2023-02-11 DIAGNOSIS — M25562 Pain in left knee: Secondary | ICD-10-CM

## 2023-02-11 MED ORDER — METHOCARBAMOL 500 MG PO TABS: 500.0000 mg | ORAL_TABLET | Freq: Three times a day (TID) | ORAL | 0 refills | Status: DC | PRN

## 2023-02-11 MED ORDER — MELOXICAM 15 MG PO TABS: 15.0000 mg | ORAL_TABLET | Freq: Every day | ORAL | 0 refills | Status: DC
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
## 2023-02-11 NOTE — Patient Instructions (Signed)
Follow up as needed or as scheduled GO to [facility] on [address] and get your xrays done [facility] call you with your Ortho appt ICE your thumb, wrist, and knee HEAT your back START the Meloxicam once daily- take w/ food DO NOT use any other anti-inflammatories (Motrin, Aleve, Advil, etc) USE Tylenol for additional pain relief as needed USE the Methocarbamol every 8 hrs as needed for spasm- may cause drowsiness Use a cushion to offload the tailbone pain Call with any questions or concerns Hang in there!!!

## 2023-02-13 ENCOUNTER — Telehealth: Payer: Self-pay

## 2023-02-13 NOTE — Telephone Encounter (Signed)
Informed pt of Xray results

## 2023-02-13 NOTE — Telephone Encounter (Signed)
-----   Message from Midge Minium, MD sent at 02/13/2023  7:30 AM EST ----- Normal thumb xray- great news!

## 2023-02-13 NOTE — Telephone Encounter (Signed)
nformed pt of Xray results

## 2023-03-04 ENCOUNTER — Other Ambulatory Visit: Payer: Self-pay | Admitting: Family Medicine

## 2023-04-30 ENCOUNTER — Encounter: Payer: Self-pay | Admitting: Gastroenterology

## 2023-07-04 ENCOUNTER — Ambulatory Visit (INDEPENDENT_AMBULATORY_CARE_PROVIDER_SITE_OTHER): Payer: 59 | Admitting: Family Medicine

## 2023-07-04 ENCOUNTER — Encounter: Payer: Self-pay | Admitting: Family Medicine

## 2023-07-04 ENCOUNTER — Telehealth: Payer: Self-pay

## 2023-07-04 VITALS — BP 138/82 | HR 60 | Temp 98.9°F | Resp 18 | Ht 71.0 in | Wt 281.0 lb

## 2023-07-04 DIAGNOSIS — E119 Type 2 diabetes mellitus without complications: Secondary | ICD-10-CM | POA: Diagnosis not present

## 2023-07-04 DIAGNOSIS — Z13220 Encounter for screening for lipoid disorders: Secondary | ICD-10-CM | POA: Diagnosis not present

## 2023-07-04 DIAGNOSIS — Z6839 Body mass index (BMI) 39.0-39.9, adult: Secondary | ICD-10-CM | POA: Diagnosis not present

## 2023-07-04 DIAGNOSIS — Z0000 Encounter for general adult medical examination without abnormal findings: Secondary | ICD-10-CM | POA: Diagnosis not present

## 2023-07-04 DIAGNOSIS — Z125 Encounter for screening for malignant neoplasm of prostate: Secondary | ICD-10-CM | POA: Diagnosis not present

## 2023-07-04 LAB — BASIC METABOLIC PANEL
BUN: 13 mg/dL (ref 6–23)
CO2: 26 mEq/L (ref 19–32)
Calcium: 9.1 mg/dL (ref 8.4–10.5)
Chloride: 104 mEq/L (ref 96–112)
Creatinine, Ser: 0.97 mg/dL (ref 0.40–1.50)
GFR: 89.14 mL/min (ref >60.00)
Glucose, Bld: 110 mg/dL — ABNORMAL HIGH (ref 70–99)
Potassium: 4.2 mEq/L (ref 3.5–5.1)
Sodium: 138 mEq/L (ref 135–145)

## 2023-07-04 LAB — HEPATIC FUNCTION PANEL
ALT: 24 U/L (ref 0–53)
AST: 20 U/L (ref 0–37)
Albumin: 3.9 g/dL (ref 3.5–5.2)
Alkaline Phosphatase: 54 U/L (ref 39–117)
Bilirubin, Direct: 0.1 mg/dL (ref 0.0–0.3)
Total Bilirubin: 0.5 mg/dL (ref 0.2–1.2)
Total Protein: 6.8 g/dL (ref 6.0–8.3)

## 2023-07-04 LAB — LIPID PANEL
Cholesterol: 131 mg/dL (ref 0–200)
HDL: 41.6 mg/dL (ref >39.00)
LDL Cholesterol: 80 mg/dL (ref 0–99)
NonHDL: 89.3
Total CHOL/HDL Ratio: 3
Triglycerides: 48 mg/dL (ref 0.0–149.0)
VLDL: 9.6 mg/dL (ref 0.0–40.0)

## 2023-07-04 LAB — CBC WITH DIFFERENTIAL/PLATELET
Basophils Absolute: 0 10*3/uL (ref 0.0–0.1)
Basophils Relative: 0.6 % (ref 0.0–3.0)
Eosinophils Absolute: 0.2 10*3/uL (ref 0.0–0.7)
Eosinophils Relative: 4.8 % (ref 0.0–5.0)
HCT: 41.9 % (ref 39.0–52.0)
Hemoglobin: 13.5 g/dL (ref 13.0–17.0)
Lymphocytes Relative: 44.5 % (ref 12.0–46.0)
Lymphs Abs: 1.7 10*3/uL (ref 0.7–4.0)
MCHC: 32.2 g/dL (ref 30.0–36.0)
MCV: 84.1 fl (ref 78.0–100.0)
Monocytes Absolute: 0.5 10*3/uL (ref 0.1–1.0)
Monocytes Relative: 12.6 % — ABNORMAL HIGH (ref 3.0–12.0)
Neutro Abs: 1.4 10*3/uL (ref 1.4–7.7)
Neutrophils Relative %: 37.5 % — ABNORMAL LOW (ref 43.0–77.0)
Platelets: 206 10*3/uL (ref 150.0–400.0)
RBC: 4.98 Mil/uL (ref 4.22–5.81)
RDW: 13.3 % (ref 11.5–15.5)
WBC: 3.7 10*3/uL — ABNORMAL LOW (ref 4.0–10.5)

## 2023-07-04 LAB — PSA: PSA: 0.67 ng/mL (ref 0.10–4.00)

## 2023-07-04 LAB — TSH: TSH: 1.28 u[IU]/mL (ref 0.35–5.50)

## 2023-07-04 LAB — MICROALBUMIN / CREATININE URINE RATIO
Creatinine,U: 70.6 mg/dL
Microalb Creat Ratio: 1 mg/g (ref 0.0–30.0)
Microalb, Ur: <0.7 mg/dL (ref 0.0–1.9)

## 2023-07-04 LAB — HEMOGLOBIN A1C: Hgb A1c MFr Bld: 6.5 % (ref 4.6–6.5)

## 2023-07-04 NOTE — Assessment & Plan Note (Signed)
Pt's PE WNL w/ exception of BMI.  UTD on Tdap.  Due for repeat colonoscopy- pt to call and schedule.  Check labs.  Anticipatory guidance provided.

## 2023-07-04 NOTE — Assessment & Plan Note (Signed)
Deteriorated.  Pt has gained 7 lbs since last visit.  Stressed need for low carb diet and regular physical activity.  Check labs to risk stratify.

## 2023-07-04 NOTE — Progress Notes (Signed)
   Subjective:    Patient ID: Christian Henry, male    DOB: 1971-05-24, 52 y.o.   MRN: [id number]  HPI CPE- UTD on Tdap, eye exam.  Due for foot exam, microalbumin,   Patient Care Team    Relationship Specialty Notifications Start End  Sheliah Hatch, MD PCP - General   12/12/10     Health Maintenance  Topic Date Due   HEMOGLOBIN A1C  12/28/2022   Colonoscopy  03/03/2023   Diabetic kidney evaluation - eGFR measurement  06/29/2023   Diabetic kidney evaluation - Urine ACR  06/29/2023   FOOT EXAM  06/29/2023   OPHTHALMOLOGY EXAM  07/10/2023   INFLUENZA VACCINE  07/31/2023   DTaP/Tdap/Td (3 - Td or Tdap) 05/09/2026   HPV VACCINES  Aged Out   COVID-19 Vaccine  Discontinued   Hepatitis C Screening  Discontinued   HIV Screening  Discontinued   Zoster Vaccines- Shingrix  Discontinued     Review of Systems Patient reports no vision/hearing changes, anorexia, fever ,adenopathy, persistant/recurrent hoarseness, swallowing issues, chest pain, palpitations, edema, persistant/recurrent cough, hemoptysis, dyspnea (rest,exertional, paroxysmal nocturnal), gastrointestinal  bleeding (melena, rectal bleeding), abdominal pain, excessive heart burn, GU symptoms (dysuria, hematuria, voiding/incontinence issues) syncope, focal weakness, memory loss, numbness & tingling, skin/hair/nail changes, depression, anxiety, abnormal bruising/bleeding.   + 7 lb weight gain    Objective:   Physical Exam General Appearance:    Alert, cooperative, no distress, appears stated age, obese  Head:    Normocephalic, without obvious abnormality, atraumatic  Eyes:    PERRL, conjunctiva/corneas clear, EOM's intact both eyes       Ears:    Normal TM's and external ear canals, both ears  Nose:   Nares normal, septum midline, mucosa normal, no drainage   or sinus tenderness  Throat:   Lips, mucosa, and tongue normal; teeth and gums normal  Neck:   Supple, symmetrical, trachea midline, no adenopathy;       thyroid:  No  enlargement/tenderness/nodules  Back:     Symmetric, no curvature, ROM normal, no CVA tenderness  Lungs:     Clear to auscultation bilaterally, respirations unlabored  Chest wall:    No tenderness or deformity  Heart:    Regular rate and rhythm, S1 and S2 normal, no murmur, rub   or gallop  Abdomen:     Soft, non-tender, bowel sounds active all four quadrants,    no masses, no organomegaly  Genitalia:    deferred  Rectal:    Extremities:   Extremities normal, atraumatic, no cyanosis or edema  Pulses:   2+ and symmetric all extremities  Skin:   Skin color, texture, turgor normal, no rashes or lesions  Lymph nodes:   Cervical, supraclavicular, and axillary nodes normal  Neurologic:   CNII-XII intact. Normal strength, sensation and reflexes      throughout          Assessment & Plan:

## 2023-07-04 NOTE — Patient Instructions (Addendum)
Follow up in 6 months to recheck blood pressure, sugar, cholesterol We'll notify you of your lab results and make any changes if needed Call and schedule your repeat colonoscopy [phone] Have them send me a copy of your eye exam Call [facility] and schedule a follow up for the ongoing knee pain Call with any questions or concerns Stay Safe!  Stay Healthy! Have a great summer!!!

## 2023-07-04 NOTE — Telephone Encounter (Signed)
-----   Message from Sheliah Hatch, MD sent at 07/04/2023  2:36 PM EDT ----- Labs look great!  No changes at this time

## 2023-07-04 NOTE — Assessment & Plan Note (Signed)
Due for A1C, microalbumin.  Eye exam scheduled for next week.  Foot exam done today.  Encouraged low carb diet and regular physical activity.  Will follow.

## 2023-07-04 NOTE — Telephone Encounter (Signed)
Pt aware of lab results 

## 2023-07-14 LAB — HM DIABETES EYE EXAM

## 2023-09-09 ENCOUNTER — Other Ambulatory Visit: Payer: Self-pay | Admitting: Family Medicine

## 2023-11-14 ENCOUNTER — Telehealth: Payer: Self-pay | Admitting: Family Medicine
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
## 2023-11-14 NOTE — Telephone Encounter (Addendum)
Does this require clinical attention or is this just an FYI?

## 2023-11-14 NOTE — Telephone Encounter (Signed)
Just FYI.

## 2024-01-12 ENCOUNTER — Ambulatory Visit: Payer: 59 | Admitting: Family Medicine

## 2024-01-14 ENCOUNTER — Encounter: Payer: Self-pay | Admitting: Gastroenterology

## 2024-01-22 ENCOUNTER — Encounter: Payer: Self-pay | Admitting: Family Medicine

## 2024-01-22 ENCOUNTER — Ambulatory Visit: Payer: 59 | Admitting: Family Medicine

## 2024-01-22 VITALS — BP 128/78 | HR 77 | Temp 98.1°F | Ht 71.0 in | Wt 290.0 lb

## 2024-01-22 DIAGNOSIS — Z114 Encounter for screening for human immunodeficiency virus [HIV]: Secondary | ICD-10-CM | POA: Diagnosis not present

## 2024-01-22 DIAGNOSIS — I10 Essential (primary) hypertension: Secondary | ICD-10-CM | POA: Diagnosis not present

## 2024-01-22 DIAGNOSIS — E1169 Type 2 diabetes mellitus with other specified complication: Secondary | ICD-10-CM

## 2024-01-22 DIAGNOSIS — E785 Hyperlipidemia, unspecified: Secondary | ICD-10-CM

## 2024-01-22 DIAGNOSIS — E119 Type 2 diabetes mellitus without complications: Secondary | ICD-10-CM

## 2024-01-22 DIAGNOSIS — Z1159 Encounter for screening for other viral diseases: Secondary | ICD-10-CM

## 2024-01-22 LAB — HEPATIC FUNCTION PANEL
ALT: 26 U/L (ref 0–53)
AST: 20 U/L (ref 0–37)
Albumin: 4.3 g/dL (ref 3.5–5.2)
Alkaline Phosphatase: 62 U/L (ref 39–117)
Bilirubin, Direct: 0.1 mg/dL (ref 0.0–0.3)
Total Bilirubin: 0.6 mg/dL (ref 0.2–1.2)
Total Protein: 7.3 g/dL (ref 6.0–8.3)

## 2024-01-22 LAB — LIPID PANEL
Cholesterol: 150 mg/dL (ref 0–200)
HDL: 44.8 mg/dL (ref >39.00)
LDL Cholesterol: 92 mg/dL (ref 0–99)
NonHDL: 105.44
Total CHOL/HDL Ratio: 3
Triglycerides: 66 mg/dL (ref 0.0–149.0)
VLDL: 13.2 mg/dL (ref 0.0–40.0)

## 2024-01-22 LAB — CBC WITH DIFFERENTIAL/PLATELET
Basophils Absolute: 0 10*3/uL (ref 0.0–0.1)
Basophils Relative: 0.6 % (ref 0.0–3.0)
Eosinophils Absolute: 0.1 10*3/uL (ref 0.0–0.7)
Eosinophils Relative: 3.2 % (ref 0.0–5.0)
HCT: 43 % (ref 39.0–52.0)
Hemoglobin: 14.2 g/dL (ref 13.0–17.0)
Lymphocytes Relative: 38 % (ref 12.0–46.0)
Lymphs Abs: 1.7 10*3/uL (ref 0.7–4.0)
MCHC: 33 g/dL (ref 30.0–36.0)
MCV: 85.1 fL (ref 78.0–100.0)
Monocytes Absolute: 0.4 10*3/uL (ref 0.1–1.0)
Monocytes Relative: 9.7 % (ref 3.0–12.0)
Neutro Abs: 2.2 10*3/uL (ref 1.4–7.7)
Neutrophils Relative %: 48.5 % (ref 43.0–77.0)
Platelets: 204 10*3/uL (ref 150.0–400.0)
RBC: 5.06 Mil/uL (ref 4.22–5.81)
RDW: 13.4 % (ref 11.5–15.5)
WBC: 4.6 10*3/uL (ref 4.0–10.5)

## 2024-01-22 LAB — TSH: TSH: 1.25 u[IU]/mL (ref 0.35–5.50)

## 2024-01-22 LAB — BASIC METABOLIC PANEL
BUN: 12 mg/dL (ref 6–23)
CO2: 29 meq/L (ref 19–32)
Calcium: 9.2 mg/dL (ref 8.4–10.5)
Chloride: 103 meq/L (ref 96–112)
Creatinine, Ser: 0.98 mg/dL (ref 0.40–1.50)
GFR: 87.71 mL/min (ref >60.00)
Glucose, Bld: 109 mg/dL — ABNORMAL HIGH (ref 70–99)
Potassium: 4.2 meq/L (ref 3.5–5.1)
Sodium: 139 meq/L (ref 135–145)

## 2024-01-22 LAB — HEMOGLOBIN A1C: Hgb A1c MFr Bld: 6.9 % — ABNORMAL HIGH (ref 4.6–6.5)

## 2024-01-22 NOTE — Assessment & Plan Note (Signed)
Chronic problem.  On Lipitor 20mg daily w/o difficulty.  Check labs.  Adjust meds prn  

## 2024-01-22 NOTE — Progress Notes (Signed)
   Subjective:    Patient ID: Christian Henry, male    DOB: [birth year], 53 y.o.   MRN: [id number]  HPI HTN- chronic problem, on Amlodipine 10mg  daily, Coreg 6.25mg  BID w/ good control.  No CP, SOB, HA's, visual changes, edema.  Hyperlipidemia- chronic problem, on Lipitor 20mg  daily.  No abd pain, N/V.  DM- chronic problem.  Currently diet controlled.  UTD on microalbumin, foot exam, eye exam.    Obesity- pt has gained 9 lbs since last visit.  Wants to lose weight.  Has decreased his exercise since losing mom in August.   Review of Systems For ROS see HPI     Objective:   Physical Exam Vitals reviewed.  Constitutional:      General: He is not in acute distress.    Appearance: Normal appearance. He is well-developed. He is obese. He is not ill-appearing.  HENT:     Head: Normocephalic and atraumatic.  Eyes:     Extraocular Movements: Extraocular movements intact.     Conjunctiva/sclera: Conjunctivae normal.     Pupils: Pupils are equal, round, and reactive to light.  Neck:     Thyroid: No thyromegaly.  Cardiovascular:     Rate and Rhythm: Normal rate and regular rhythm.     Pulses: Normal pulses.     Heart sounds: Normal heart sounds. No murmur heard. Pulmonary:     Effort: Pulmonary effort is normal. No respiratory distress.     Breath sounds: Normal breath sounds.  Abdominal:     General: Bowel sounds are normal. There is no distension.     Palpations: Abdomen is soft.  Musculoskeletal:     Cervical back: Normal range of motion and neck supple.     Right lower leg: No edema.     Left lower leg: No edema.  Lymphadenopathy:     Cervical: No cervical adenopathy.  Skin:    General: Skin is warm and dry.  Neurological:     General: No focal deficit present.     Mental Status: He is alert and oriented to person, place, and time.     Cranial Nerves: No cranial nerve deficit.  Psychiatric:        Mood and Affect: Mood normal.        Behavior: Behavior normal.            Assessment & Plan:

## 2024-01-22 NOTE — Patient Instructions (Signed)
Schedule your complete physical in 6 months We'll notify you of your lab results and make any changes if needed Try and get back to healthy diet and regular exercise- you can do it! Call with any questions or concerns Stay Safe!  Stay Healthy! Happy Belated Birthday!

## 2024-01-22 NOTE — Assessment & Plan Note (Signed)
Chronic problem.  Has been diet controlled but pt has gained 9 lbs since losing his mom in August.  Knows he wants to get back on track.  UTD on eye exam, foot exam, microalbumin.  Check labs and start meds prn.

## 2024-01-22 NOTE — Assessment & Plan Note (Signed)
Deteriorated.  Pt has gained 9 lbs since last visit.  BMI now 40.45  Encouraged low carb diet and regular exercise.  Will follow.

## 2024-01-22 NOTE — Assessment & Plan Note (Signed)
Chronic problem.  Well controlled on Amlodipine, Coreg.  Currently asymptomatic.  No changes at this time.

## 2024-01-23 LAB — HEPATITIS C ANTIBODY: Hepatitis C Ab: NONREACTIVE

## 2024-01-23 LAB — HIV ANTIBODY (ROUTINE TESTING W REFLEX): HIV 1&2 Ab, 4th Generation: NONREACTIVE

## 2024-01-25 ENCOUNTER — Encounter: Payer: Self-pay | Admitting: Family Medicine

## 2024-01-26 ENCOUNTER — Telehealth: Payer: Self-pay

## 2024-01-26 NOTE — Telephone Encounter (Signed)
Pt has reviewed labs via MyChart

## 2024-01-26 NOTE — Telephone Encounter (Signed)
-----   Message from Neena Rhymes sent at 01/25/2024  5:24 PM EST ----- A1C has increased from 6.5 --> 6.9%  This is likely a combination of holiday and emotional eating.  No need for meds at this time, just try and get back on track with healthy diet and regular exercise.  Remainder of labs look great!

## 2024-01-30 ENCOUNTER — Ambulatory Visit (AMBULATORY_SURGERY_CENTER): Payer: Managed Care, Other (non HMO) | Admitting: *Deleted

## 2024-01-30 VITALS — Ht 71.0 in | Wt 280.0 lb

## 2024-01-30 DIAGNOSIS — Z860100 Personal history of colon polyps, unspecified: Secondary | ICD-10-CM

## 2024-01-30 MED ORDER — SUFLAVE 178.7 G PO SOLR: 1.0000 | Freq: Once | ORAL | 0 refills | Status: AC

## 2024-01-30 NOTE — Progress Notes (Signed)
Pt's name and DOB verified at the beginning of the pre-visit wit 2 identifiers  Pt denies any difficulty with ambulating,sitting, laying down or rolling side to side  Pt has no issues with ambulation   Pt has no issues moving head neck or swallowing  No egg or soy allergy known to patient   No issues known to pt with past sedation with any surgeries or procedures  Pt denies having issues being intubated  No FH of Malignant Hyperthermia  Pt is not on diet pills or shots  Pt is not on home 02   Pt is not on blood thinners   Pt denies issues with constipation   Pt is not on dialysis  Pt denise any abnormal heart rhythms   Pt denies any upcoming cardiac testing  Pt encouraged to use to use Singlecare or Goodrx to reduce cost   Patient's chart reviewed by Cathlyn Parsons CNRA prior to pre-visit and patient appropriate for the [facility].  Pre-visit completed and red dot placed by patient's name on their procedure day (on provider's schedule).  .  Visit by phone  Pt states weight is 280 lb  Instructed pt why it is important to and  to call if they have any changes in health or new medications. Directed them to the # given and on instructions.     Instructions reviewed. Pt given both [facility] main # and MD on call # prior to instructions.  Pt states understanding. Instructed to review again prior to procedure. Pt states they will.   Informed pt that they will receive a call from [facility] regarding there prep med.

## 2024-01-30 NOTE — Addendum Note (Signed)
Addended by: Danton Sewer on: 01/30/2024 04:49 PM   Modules accepted: Orders

## 2024-02-13 ENCOUNTER — Encounter: Payer: Self-pay | Admitting: Gastroenterology

## 2024-02-17 ENCOUNTER — Encounter: Payer: Self-pay | Admitting: Gastroenterology

## 2024-02-17 ENCOUNTER — Ambulatory Visit (AMBULATORY_SURGERY_CENTER): Payer: 59 | Admitting: Gastroenterology

## 2024-02-17 VITALS — BP 117/73 | HR 64 | Temp 98.2°F | Resp 10

## 2024-02-17 DIAGNOSIS — Z860101 Personal history of adenomatous and serrated colon polyps: Secondary | ICD-10-CM

## 2024-02-17 DIAGNOSIS — K648 Other hemorrhoids: Secondary | ICD-10-CM | POA: Diagnosis not present

## 2024-02-17 DIAGNOSIS — K644 Residual hemorrhoidal skin tags: Secondary | ICD-10-CM

## 2024-02-17 DIAGNOSIS — Z1211 Encounter for screening for malignant neoplasm of colon: Secondary | ICD-10-CM

## 2024-02-17 DIAGNOSIS — Z860100 Personal history of colon polyps, unspecified: Secondary | ICD-10-CM

## 2024-02-17 MED ORDER — SODIUM CHLORIDE 0.9 % IV SOLN: 500.0000 mL | Freq: Once | INTRAVENOUS | Status: DC

## 2024-02-17 NOTE — Patient Instructions (Signed)

## 2024-02-17 NOTE — Op Note (Signed)
 [facility] Patient Name: Christian Henry Procedure Date: 02/17/2024 1:16 PM MRN: [id number] Endoscopist: Napoleon Form , MD, [id number] Age: 53 Referring MD:  Date of Birth: Oct 06, 1971 Gender: Male Account #: [id number] Procedure:                Colonoscopy Indications:              High risk colon cancer surveillance: Personal                            history of colonic polyps, High risk colon cancer                            surveillance: Personal history of adenoma (10 mm or                            greater in size) Medicines:                Monitored Anesthesia Care Procedure:                Pre-Anesthesia Assessment:                           - Prior to the procedure, a History and Physical                            was performed, and patient medications and                            allergies were reviewed. The patient's tolerance of                            previous anesthesia was also reviewed. The risks                            and benefits of the procedure and the sedation                            options and risks were discussed with the patient.                            All questions were answered, and informed consent                            was obtained. Prior Anticoagulants: The patient has                            taken no anticoagulant or antiplatelet agents. ASA                            Grade Assessment: II - A patient with mild systemic                            disease. After reviewing the risks and benefits,  the patient was deemed in satisfactory condition to                            undergo the procedure.                           After obtaining informed consent, the colonoscope                            was passed under direct vision. Throughout the                            procedure, the patient's blood pressure, pulse, and                            oxygen saturations were monitored  continuously. The                            [id number]-HQ190L Colonoscope [id number] was introduced                            through the anus and advanced to the the cecum,                            identified by appendiceal orifice and ileocecal                            valve. The colonoscopy was performed without                            difficulty. The patient tolerated the procedure                            well. The quality of the bowel preparation was                            adequate to identify polyps greater than 5 mm in                            size. The ileocecal valve, appendiceal orifice, and                            rectum were photographed. Scope In: 1:52:39 PM Scope Out: 2:05:29 PM Scope Withdrawal Time: 0 hours 6 minutes 34 seconds  Total Procedure Duration: 0 hours 12 minutes 50 seconds  Findings:                 The perianal and digital rectal examinations were                            normal.                           Non-bleeding external and internal hemorrhoids were  found during retroflexion. The hemorrhoids were                            medium-sized. Complications:            No immediate complications. Estimated Blood Loss:     Estimated blood loss was minimal. Impression:               - Non-bleeding external and internal hemorrhoids.                           - No specimens collected. Recommendation:           - Patient has a contact number available for                            emergencies. The signs and symptoms of potential                            delayed complications were discussed with the                            patient. Return to normal activities tomorrow.                            Written discharge instructions were provided to the                            patient.                           - Resume previous diet.                           - Continue present medications.                           -  Repeat colonoscopy in 5 years for surveillance. Napoleon Form, MD 02/17/2024 2:21:46 PM This report has been signed electronically.

## 2024-02-17 NOTE — Progress Notes (Signed)
 [facility] History and Physical   Primary Care Physician:  Sheliah Hatch, MD   Reason for Procedure:  History of adenomatous colon polyps  Plan:    Surveillance colonoscopy with possible interventions as needed     HPI: Christian Henry is a very pleasant 53 y.o. male here for surveillance colonoscopy. Denies any nausea, vomiting, abdominal pain, melena or bright red blood per rectum  The risks and benefits as well as alternatives of endoscopic procedure(s) have been discussed and reviewed. All questions answered. The patient agrees to proceed.    Past Medical History:  Diagnosis Date   Diabetes mellitus without complication (HCC)    pre-diabetic-no meds   Hyperlipidemia    Hypertension    Macular degeneration    Sleep apnea    uses CPAP     Past Surgical History:  Procedure Laterality Date   BACK SURGERY  12/2009    Prior to Admission medications   Medication Sig Start Date End Date Taking? Authorizing Provider  amLODipine (NORVASC) 10 MG tablet TAKE 1 TABLET BY MOUTH EVERY DAY 09/09/23  Yes Sheliah Hatch, MD  atorvastatin (LIPITOR) 20 MG tablet TAKE 1 TABLET BY MOUTH EVERY DAY 09/09/23  Yes Sheliah Hatch, MD  azelastine (ASTELIN) 0.1 % nasal spray Place 1 spray into both nostrils 2 (two) times daily. Use in each nostril as directed 01/01/22  Yes Janeece Agee, NP  carvedilol (COREG) 6.25 MG tablet TAKE 1 TABLET BY MOUTH TWICE A DAY WITH FOOD 09/09/23  Yes Sheliah Hatch, MD  Cholecalciferol (VITAMIN D3) 25 MCG (1000 UT) CAPS Take by mouth.   Yes [provider]  fluticasone (FLONASE) 50 MCG/ACT nasal spray Place 2 sprays into both nostrils daily. 05/02/20  Yes Sheliah Hatch, MD  MAGNESIUM PO Take by mouth.   Yes [provider]  Multiple Vitamins-Minerals (PRESERVISION AREDS 2) CAPS Take by mouth.   Yes [provider]  Omega-3 Fatty Acids (FISH OIL) 1200 MG CAPS Take by mouth daily.   Yes [provider]  Potassium 99 MG TABS Take by mouth.   Yes [provider]  OVER THE COUNTER MEDICATION Multivitamin    [provider]  Potassium Gluconate 550 MG TABS Take by mouth daily.    [provider]    Current Outpatient Medications  Medication Sig Dispense Refill   amLODipine (NORVASC) 10 MG tablet TAKE 1 TABLET BY MOUTH EVERY DAY 30 tablet 14   atorvastatin (LIPITOR) 20 MG tablet TAKE 1 TABLET BY MOUTH EVERY DAY 30 tablet 14   azelastine (ASTELIN) 0.1 % nasal spray Place 1 spray into both nostrils 2 (two) times daily. Use in each nostril as directed 30 mL 12   carvedilol (COREG) 6.25 MG tablet TAKE 1 TABLET BY MOUTH TWICE A DAY WITH FOOD 60 tablet 5   Cholecalciferol (VITAMIN D3) 25 MCG (1000 UT) CAPS Take by mouth.     fluticasone (FLONASE) 50 MCG/ACT nasal spray Place 2 sprays into both nostrils daily. 16 g 6   MAGNESIUM PO Take by mouth.     Multiple Vitamins-Minerals (PRESERVISION AREDS 2) CAPS Take by mouth.     Omega-3 Fatty Acids (FISH OIL) 1200 MG CAPS Take by mouth daily.     Potassium 99 MG TABS Take by mouth.     OVER THE COUNTER MEDICATION Multivitamin     Potassium Gluconate 550 MG TABS Take by mouth daily.     Current Facility-Administered Medications  Medication Dose Route Frequency Provider  Last Rate Last Admin   0.9 %  sodium chloride infusion  500 mL Intravenous Once Napoleon Form, MD        Allergies as of 02/17/2024 - Review Complete 02/17/2024  Allergen Reaction Noted   Lisinopril  08/07/2009    Family History  Problem Relation Age of Onset   Colon cancer Neg Hx    Colon polyps Neg Hx    Esophageal cancer Neg Hx    Rectal cancer Neg Hx    Stomach cancer Neg Hx     Social History   Socioeconomic History   Marital status: Married    Spouse name: Not on file   Number of children: Not on file   Years of education: Not on file   Highest education level: Not on file  Occupational History   Not on file   Tobacco Use   Smoking status: Never   Smokeless tobacco: Never  Vaping Use   Vaping status: Never Used  Substance and Sexual Activity   Alcohol use: Yes    Alcohol/week: 4.0 standard drinks of alcohol    Types: 4 Cans of beer per week   Drug use: No   Sexual activity: Not on file  Other Topics Concern   Not on file  Social History Narrative   Married, 2 kids.   Social Drivers of Corporate investment banker Strain: Not on file  Food Insecurity: Not on file  Transportation Needs: Not on file  Physical Activity: Not on file  Stress: Not on file  Social Connections: Not on file  Intimate Partner Violence: Not on file    Review of Systems:  All other review of systems negative except as mentioned in the HPI.  Physical Exam: Vital signs in last 24 hours: Temp 98.2 F (36.8 C)   Resp 13   SpO2 97%  General:   Alert, NAD Lungs:  Clear .   Heart:  Regular rate and rhythm Abdomen:  Soft, nontender and nondistended. Neuro/Psych:  Alert and cooperative. Normal mood and affect. A and O x 3  Reviewed labs, radiology imaging, old records and pertinent past GI work up  Patient is appropriate for planned procedure(s) and anesthesia in an ambulatory setting   K. Scherry Ran , MD [phone]

## 2024-02-17 NOTE — Progress Notes (Signed)
 Pt's states no medical or surgical changes since previsit or office visit.

## 2024-02-17 NOTE — Progress Notes (Signed)
 Sedate, gd SR, tolerated procedure well, VSS, report to RN

## 2024-02-18 ENCOUNTER — Telehealth: Payer: Self-pay

## 2024-02-18 NOTE — Telephone Encounter (Signed)
  Follow up Call-     02/17/2024    1:11 PM  Call back number  Post procedure Call Back phone  # [phone]  Permission to leave phone message Yes     Patient questions:  Do you have a fever, pain , or abdominal swelling? No. Pain Score  0 *  Have you tolerated food without any problems? Yes.    Have you been able to return to your normal activities? Yes.    Do you have any questions about your discharge instructions: Diet   No. Medications  No. Follow up visit  No.  Do you have questions or concerns about your Care? No.  Actions: * If pain score is 4 or above: No action needed, pain <4.

## 2024-02-20 ENCOUNTER — Encounter: Payer: Self-pay | Admitting: Family Medicine

## 2024-03-08 ENCOUNTER — Other Ambulatory Visit: Payer: Self-pay | Admitting: Family Medicine

## 2024-09-06 ENCOUNTER — Other Ambulatory Visit: Payer: Self-pay | Admitting: Family Medicine

## 2024-09-06 NOTE — Telephone Encounter (Signed)
 Has been scheduled for 12/18

## 2024-12-03 ENCOUNTER — Other Ambulatory Visit: Payer: Self-pay | Admitting: Family Medicine

## 2024-12-16 ENCOUNTER — Encounter: Payer: Self-pay | Admitting: Family Medicine

## 2024-12-16 ENCOUNTER — Ambulatory Visit (INDEPENDENT_AMBULATORY_CARE_PROVIDER_SITE_OTHER): Admitting: Family Medicine

## 2024-12-16 VITALS — BP 132/78 | HR 66 | Temp 98.5°F | Ht 71.25 in | Wt 282.6 lb

## 2024-12-16 DIAGNOSIS — Z125 Encounter for screening for malignant neoplasm of prostate: Secondary | ICD-10-CM

## 2024-12-16 DIAGNOSIS — E119 Type 2 diabetes mellitus without complications: Secondary | ICD-10-CM

## 2024-12-16 DIAGNOSIS — Z0000 Encounter for general adult medical examination without abnormal findings: Secondary | ICD-10-CM | POA: Diagnosis not present

## 2024-12-16 LAB — LIPID PANEL
Cholesterol: 108 mg/dL (ref 28–200)
HDL: 40.3 mg/dL (ref >39.00)
LDL Cholesterol: 57 mg/dL (ref 10–99)
NonHDL: 67.53
Total CHOL/HDL Ratio: 3
Triglycerides: 54 mg/dL (ref 10.0–149.0)
VLDL: 10.8 mg/dL (ref 0.0–40.0)

## 2024-12-16 LAB — HEPATIC FUNCTION PANEL
ALT: 26 U/L (ref 3–53)
AST: 20 U/L (ref 5–37)
Albumin: 4.3 g/dL (ref 3.5–5.2)
Alkaline Phosphatase: 59 U/L (ref 39–117)
Bilirubin, Direct: 0.2 mg/dL (ref 0.1–0.3)
Total Bilirubin: 0.8 mg/dL (ref 0.2–1.2)
Total Protein: 7.3 g/dL (ref 6.0–8.3)

## 2024-12-16 LAB — MICROALBUMIN / CREATININE URINE RATIO
Creatinine,U: 195.5 mg/dL
Microalb Creat Ratio: 3.9 mg/g (ref 0.0–30.0)
Microalb, Ur: 0.8 mg/dL (ref 0.7–1.9)

## 2024-12-16 LAB — CBC WITH DIFFERENTIAL/PLATELET
Basophils Absolute: 0 K/uL (ref 0.0–0.1)
Basophils Relative: 0.8 % (ref 0.0–3.0)
Eosinophils Absolute: 0.2 K/uL (ref 0.0–0.7)
Eosinophils Relative: 4.3 % (ref 0.0–5.0)
HCT: 40.9 % (ref 39.0–52.0)
Hemoglobin: 13.4 g/dL (ref 13.0–17.0)
Lymphocytes Relative: 38.3 % (ref 12.0–46.0)
Lymphs Abs: 1.6 K/uL (ref 0.7–4.0)
MCHC: 32.8 g/dL (ref 30.0–36.0)
MCV: 83.5 fl (ref 78.0–100.0)
Monocytes Absolute: 0.5 K/uL (ref 0.1–1.0)
Monocytes Relative: 11.7 % (ref 3.0–12.0)
Neutro Abs: 1.9 K/uL (ref 1.4–7.7)
Neutrophils Relative %: 44.9 % (ref 43.0–77.0)
Platelets: 222 K/uL (ref 150.0–400.0)
RBC: 4.9 Mil/uL (ref 4.22–5.81)
RDW: 13.3 % (ref 11.5–15.5)
WBC: 4.3 K/uL (ref 4.0–10.5)

## 2024-12-16 LAB — BASIC METABOLIC PANEL WITH GFR
BUN: 15 mg/dL (ref 6–23)
CO2: 28 meq/L (ref 19–32)
Calcium: 9.1 mg/dL (ref 8.4–10.5)
Chloride: 103 meq/L (ref 96–112)
Creatinine, Ser: 1.02 mg/dL (ref 0.40–1.50)
GFR: 83.07 mL/min (ref >60.00)
Glucose, Bld: 104 mg/dL — ABNORMAL HIGH (ref 70–99)
Potassium: 4.1 meq/L (ref 3.5–5.1)
Sodium: 139 meq/L (ref 135–145)

## 2024-12-16 LAB — HEMOGLOBIN A1C: Hgb A1c MFr Bld: 6.7 % — ABNORMAL HIGH (ref 4.6–6.5)

## 2024-12-16 LAB — PSA: PSA: 0.78 ng/mL (ref 0.10–4.00)

## 2024-12-16 LAB — TSH: TSH: 1.51 u[IU]/mL (ref 0.35–5.50)

## 2024-12-16 NOTE — Assessment & Plan Note (Signed)
 Chronic problem.  Attempting to control w/ diet and exercise.  Joined weight loss program at work.  Check labs.  Start meds prn.

## 2024-12-16 NOTE — Patient Instructions (Addendum)
 Follow up in 6 months to recheck sugar, blood pressure, cholesterol We'll notify you of your lab results and make any changes if needed Continue to work on healthy diet and regular exercise- you can do it! Call with any questions or concerns Stay Safe!  Stay Healthy! Happy Holidays!!!

## 2024-12-16 NOTE — Assessment & Plan Note (Signed)
 Pt's PE WNL w/ exception of BMI.  UTD on eye exam, colonoscopy, Tdap.  Foot exam done today.  Microalbumin ordered.  Check labs.  Anticipatory guidance provided.

## 2024-12-16 NOTE — Progress Notes (Signed)
° °  Subjective:    Patient ID: Christian Henry, male    DOB: 09/26/1971, 53 y.o.   MRN: [id number]  HPI CPE- due for foot exam, microalbumin.  UTD on eye exam.  UTD on Tdap, colonoscopy.  Pt signed up for weight loss program through work.  Health Maintenance  Topic Date Due   Diabetic kidney evaluation - Urine ACR  Never done   Zoster Vaccines- Shingrix (1 of 2) Never done   FOOT EXAM  07/03/2024   OPHTHALMOLOGY EXAM  07/13/2024   HEMOGLOBIN A1C  07/21/2024   COVID-19 Vaccine (4 - 2025-26 season) 08/30/2024   Influenza Vaccine  03/29/2025 (Originally 07/30/2024)   Pneumococcal Vaccine: 50+ Years (1 of 2 - PCV) 12/16/2025 (Originally 01/05/1989)   Hepatitis B Vaccines 19-59 Average Risk (1 of 3 - 19+ 3-dose series) 12/16/2025 (Originally 01/05/1989)   Diabetic kidney evaluation - eGFR measurement  01/21/2025   DTaP/Tdap/Td (3 - Td or Tdap) 05/09/2026   Colonoscopy  02/16/2029   Hepatitis C Screening  Completed   HIV Screening  Completed   HPV VACCINES  Aged Out   Meningococcal B Vaccine  Aged Out    Patient Care Team    Relationship Specialty Notifications Start End  Mahlon Comer BRAVO, MD PCP - General   12/12/10   Debarah Lorrene DEL., MD  Ophthalmology  01/22/24      Review of Systems Patient reports no vision/hearing changes, anorexia, fever ,adenopathy, persistant/recurrent hoarseness, swallowing issues, chest pain, palpitations, edema, persistant/recurrent cough, hemoptysis, dyspnea (rest,exertional, paroxysmal nocturnal), gastrointestinal  bleeding (melena, rectal bleeding), abdominal pain, excessive heart burn, GU symptoms (dysuria, hematuria, voiding/incontinence issues) syncope, focal weakness, memory loss, numbness & tingling, skin/hair/nail changes, depression, anxiety, abnormal bruising/bleeding, musculoskeletal symptoms/signs.     Objective:   Physical Exam General Appearance:    Alert, cooperative, no distress, appears stated age, obese  Head:    Normocephalic, without obvious  abnormality, atraumatic  Eyes:    PERRL, conjunctiva/corneas clear, EOM's intact both eyes       Ears:    Normal TM's and external ear canals, both ears  Nose:   Nares normal, septum midline, mucosa normal, no drainage   or sinus tenderness  Throat:   Lips, mucosa, and tongue normal; teeth and gums normal  Neck:   Supple, symmetrical, trachea midline, no adenopathy;       thyroid :  No enlargement/tenderness/nodules  Back:     Symmetric, no curvature, ROM normal, no CVA tenderness  Lungs:     Clear to auscultation bilaterally, respirations unlabored  Chest wall:    No tenderness or deformity  Heart:    Regular rate and rhythm, S1 and S2 normal, no murmur, rub   or gallop  Abdomen:     Soft, non-tender, bowel sounds active all four quadrants,    no masses, no organomegaly  Genitalia:    deferred  Rectal:    Extremities:   Extremities normal, atraumatic, no cyanosis or edema  Pulses:   2+ and symmetric all extremities  Skin:   Skin color, texture, turgor normal, no rashes or lesions  Lymph nodes:   Cervical, supraclavicular, and axillary nodes normal  Neurologic:   CNII-XII intact. Normal strength, sensation and reflexes      throughout          Assessment & Plan:

## 2024-12-17 ENCOUNTER — Ambulatory Visit: Payer: Self-pay | Admitting: Family Medicine

## 2025-02-02 ENCOUNTER — Encounter: Payer: Self-pay | Admitting: Family Medicine

## 2025-02-02 ENCOUNTER — Ambulatory Visit: Admitting: Family Medicine

## 2025-02-02 VITALS — BP 160/94 | HR 74 | Temp 98.2°F | Ht 71.25 in | Wt 288.0 lb

## 2025-02-02 DIAGNOSIS — W009XXD Unspecified fall due to ice and snow, subsequent encounter: Secondary | ICD-10-CM

## 2025-02-02 DIAGNOSIS — M542 Cervicalgia: Secondary | ICD-10-CM

## 2025-02-02 DIAGNOSIS — S63502D Unspecified sprain of left wrist, subsequent encounter: Secondary | ICD-10-CM

## 2025-02-02 NOTE — Patient Instructions (Addendum)
 Follow up as needed or as scheduled START the prescription Ibuprofen 800mg  3x/day w/ food for the next 7 days and then as needed You can add Tylenol  to help w/ pain HEAT your shoulders ICE your wrist and forearm Wear the splint to decrease motion and allow healing USE the Flexeril  (muscle relaxer) to allow you to sleep at night- may cause drowsiness Call with any questions or concerns Hang in there!

## 2025-02-02 NOTE — Progress Notes (Unsigned)
" ° °  Subjective:    Patient ID: Christian Henry, male    DOB: 27-Nov-1971, 54 y.o.   MRN: [id number]  HPI ER f/u- pt was seen on 1/31 after slipping and falling on ice.  He presented w/ pain in L wrist, neck pain, and across collar bones.  He did not hit his head or have LOC.  Had CT of neck and xrays of wrist that were negative for acute injury.  Was prescribed Flexeril  and Ibuprofen 800mg  to use PRN= but has not picked up medication due to snow.  Pain has improved across his collar bones but having bilateral posterior neck pain, lower back stiffness, L wrist pain that extends into forearm.   Review of Systems For ROS see HPI     Objective:   Physical Exam        Assessment & Plan:    "

## 2025-06-16 ENCOUNTER — Ambulatory Visit: Admitting: Family Medicine
